# Patient Record
Sex: Female | Born: 1960 | Race: White | Hispanic: No | Smoking: Current every day smoker
Health system: Southern US, Community
[De-identification: ages and names within clinical notes are randomized; demographics above are authoritative.]

## PROBLEM LIST (undated history)

## (undated) DIAGNOSIS — F419 Anxiety disorder, unspecified: Secondary | ICD-10-CM

## (undated) DIAGNOSIS — F32A Depression, unspecified: Secondary | ICD-10-CM

## (undated) DIAGNOSIS — Z720 Tobacco use: Secondary | ICD-10-CM

## (undated) DIAGNOSIS — C801 Malignant (primary) neoplasm, unspecified: Secondary | ICD-10-CM

## (undated) DIAGNOSIS — J449 Chronic obstructive pulmonary disease, unspecified: Secondary | ICD-10-CM

## (undated) DIAGNOSIS — J45909 Unspecified asthma, uncomplicated: Secondary | ICD-10-CM

## (undated) DIAGNOSIS — J42 Unspecified chronic bronchitis: Secondary | ICD-10-CM

## (undated) DIAGNOSIS — Z8669 Personal history of other diseases of the nervous system and sense organs: Secondary | ICD-10-CM

## (undated) DIAGNOSIS — D72829 Elevated white blood cell count, unspecified: Secondary | ICD-10-CM

## (undated) HISTORY — PX: LEEP: SHX91

## (undated) HISTORY — PX: ABDOMINAL HYSTERECTOMY: SHX81

## (undated) HISTORY — DX: Chronic obstructive pulmonary disease, unspecified: J44.9

## (undated) HISTORY — DX: Unspecified asthma, uncomplicated: J45.909

---

## 2006-09-15 ENCOUNTER — Ambulatory Visit: Payer: Self-pay | Admitting: Unknown Physician Specialty

## 2007-09-19 ENCOUNTER — Ambulatory Visit: Payer: Self-pay | Admitting: Unknown Physician Specialty

## 2008-09-23 ENCOUNTER — Ambulatory Visit: Payer: Self-pay | Admitting: Unknown Physician Specialty

## 2009-09-29 ENCOUNTER — Ambulatory Visit: Payer: Self-pay | Admitting: Unknown Physician Specialty

## 2010-10-06 ENCOUNTER — Ambulatory Visit: Payer: Self-pay | Admitting: Unknown Physician Specialty

## 2011-11-01 ENCOUNTER — Ambulatory Visit: Payer: Self-pay | Admitting: Unknown Physician Specialty

## 2011-11-04 ENCOUNTER — Ambulatory Visit: Payer: Self-pay | Admitting: Unknown Physician Specialty

## 2013-05-16 ENCOUNTER — Ambulatory Visit: Payer: Self-pay | Admitting: Internal Medicine

## 2013-11-20 ENCOUNTER — Ambulatory Visit: Payer: Self-pay | Admitting: Internal Medicine

## 2016-04-21 ENCOUNTER — Other Ambulatory Visit: Payer: Self-pay | Admitting: Internal Medicine

## 2016-04-21 DIAGNOSIS — Z1231 Encounter for screening mammogram for malignant neoplasm of breast: Secondary | ICD-10-CM

## 2016-04-28 ENCOUNTER — Ambulatory Visit: Admission: RE | Admit: 2016-04-28 | Payer: Self-pay | Source: Ambulatory Visit

## 2016-11-02 ENCOUNTER — Ambulatory Visit
Admission: RE | Admit: 2016-11-02 | Discharge: 2016-11-02 | Disposition: A | Discharge status: Home / Self Care | Payer: Managed Care, Other (non HMO) | Source: Ambulatory Visit | Attending: Internal Medicine | Admitting: Internal Medicine

## 2016-11-02 ENCOUNTER — Ambulatory Visit: Payer: Self-pay

## 2016-11-02 DIAGNOSIS — Z1231 Encounter for screening mammogram for malignant neoplasm of breast: Secondary | ICD-10-CM | POA: Diagnosis present

## 2016-11-02 HISTORY — DX: Malignant (primary) neoplasm, unspecified: C80.1

## 2017-12-01 ENCOUNTER — Other Ambulatory Visit: Payer: Self-pay | Admitting: Internal Medicine

## 2017-12-01 DIAGNOSIS — Z1231 Encounter for screening mammogram for malignant neoplasm of breast: Secondary | ICD-10-CM

## 2017-12-06 ENCOUNTER — Ambulatory Visit: Payer: Managed Care, Other (non HMO)

## 2018-01-03 ENCOUNTER — Ambulatory Visit: Payer: Managed Care, Other (non HMO)

## 2018-01-16 ENCOUNTER — Ambulatory Visit
Admission: RE | Admit: 2018-01-16 | Discharge: 2018-01-16 | Disposition: A | Discharge status: Home / Self Care | Payer: 59 | Source: Ambulatory Visit | Attending: Internal Medicine | Admitting: Internal Medicine

## 2018-01-16 DIAGNOSIS — Z1231 Encounter for screening mammogram for malignant neoplasm of breast: Secondary | ICD-10-CM

## 2019-10-09 ENCOUNTER — Other Ambulatory Visit: Payer: Self-pay | Admitting: Internal Medicine

## 2019-10-09 DIAGNOSIS — Z1231 Encounter for screening mammogram for malignant neoplasm of breast: Secondary | ICD-10-CM

## 2020-10-07 ENCOUNTER — Other Ambulatory Visit: Payer: Self-pay

## 2020-10-07 ENCOUNTER — Ambulatory Visit
Admission: RE | Admit: 2020-10-07 | Discharge: 2020-10-07 | Disposition: A | Discharge status: Home / Self Care | Payer: 59 | Source: Ambulatory Visit | Attending: Internal Medicine | Admitting: Internal Medicine

## 2020-10-07 DIAGNOSIS — Z1231 Encounter for screening mammogram for malignant neoplasm of breast: Secondary | ICD-10-CM | POA: Insufficient documentation

## 2021-02-09 IMAGING — MG DIGITAL SCREENING BILAT W/ TOMO W/ CAD
8 series · 8 of 24 positions shown · non-contrast
Comparison: Previous exam(s).

ACR Breast Density Category a: The breast tissue is almost entirely
fatty.

CLINICAL DATA: Screening.

EXAM:
DIGITAL SCREENING BILATERAL MAMMOGRAM WITH TOMO AND CAD

[R MLO synth-2D]
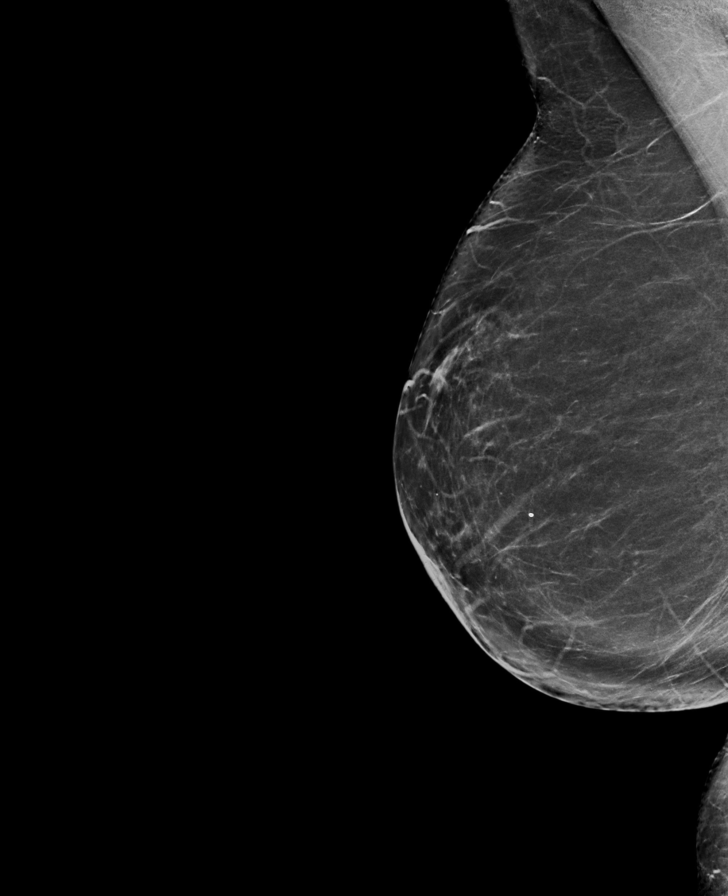

[L MLO synth-2D]
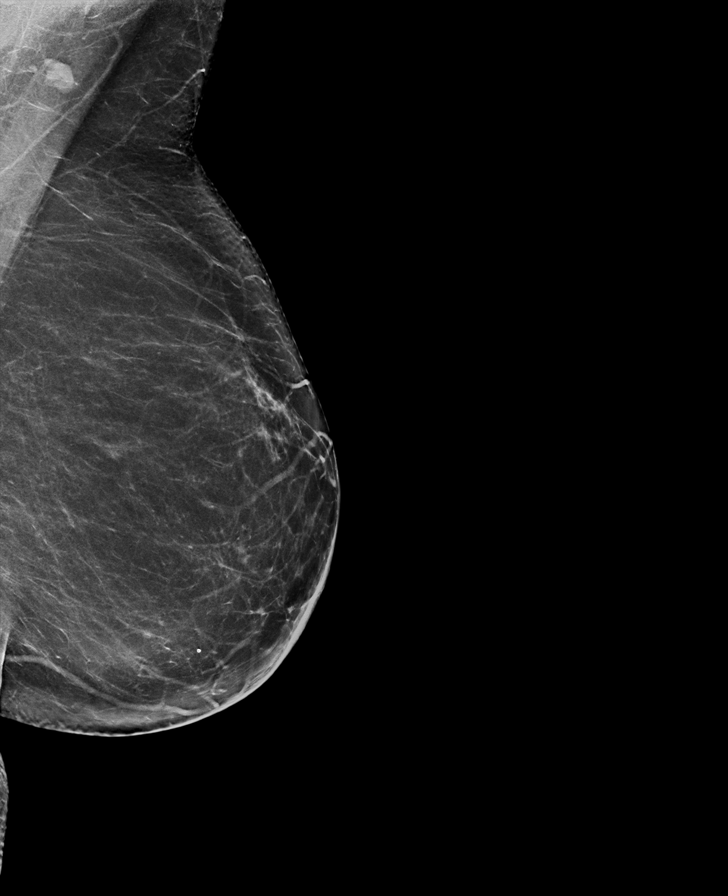

[R CC synth-2D]
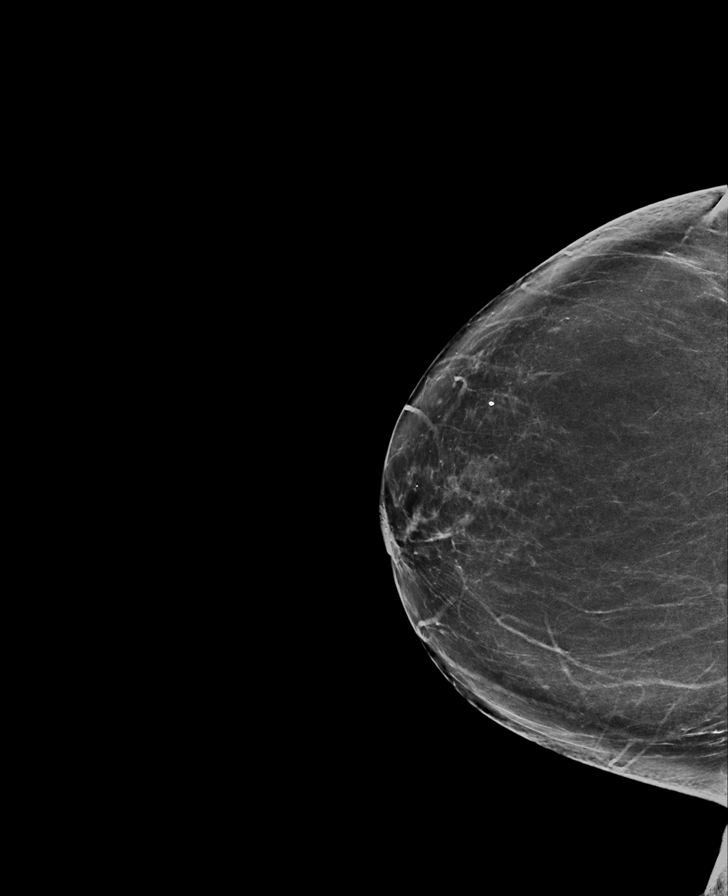

[L CC synth-2D]
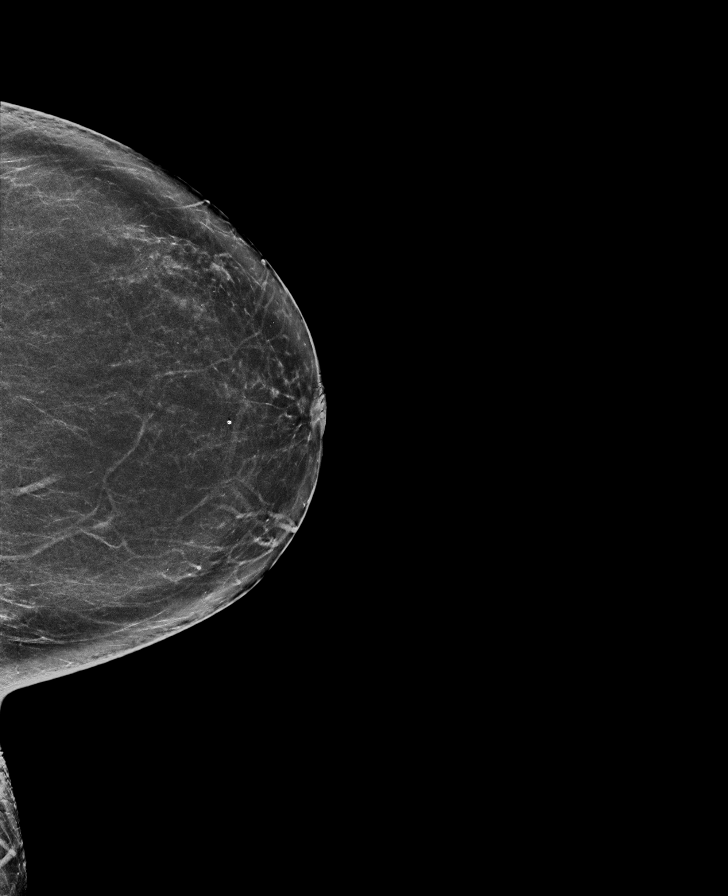

[L MLO tomo · tomo slice 39/78.0]
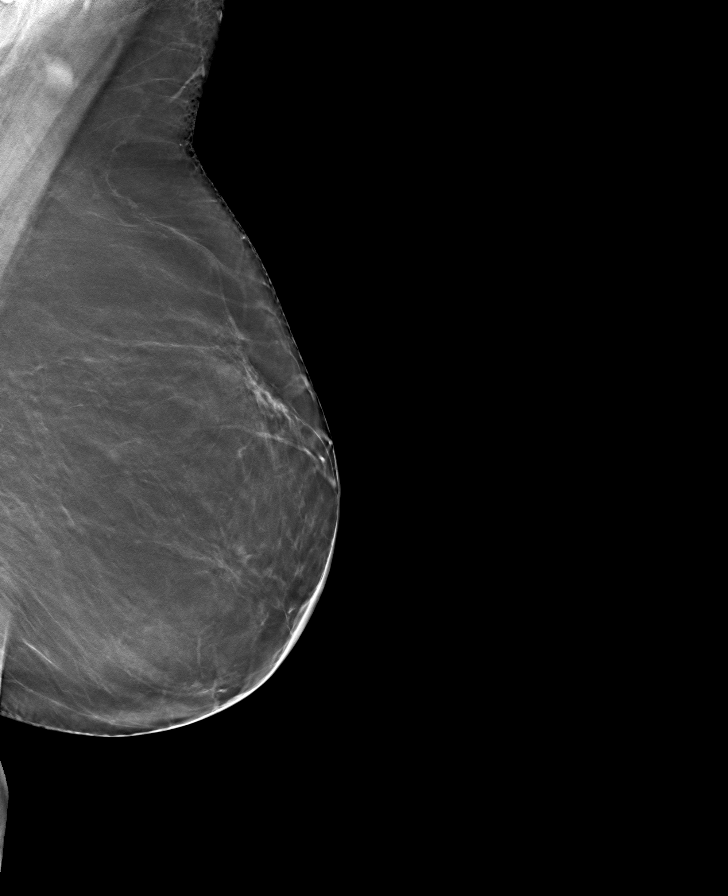

[R MLO tomo · tomo slice 39/78.0]
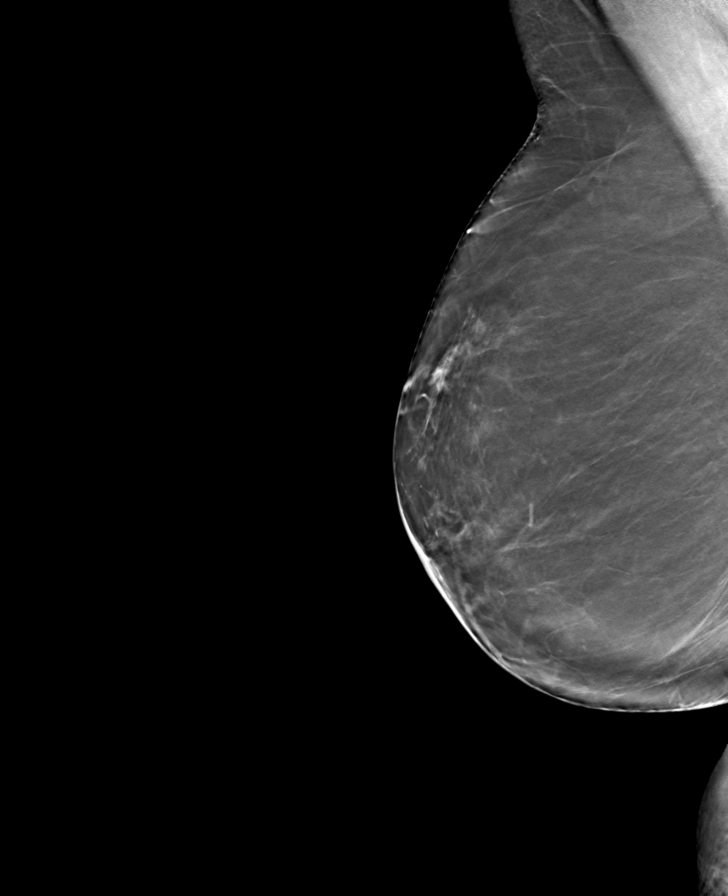

[R CC tomo · tomo slice 39/78.0]
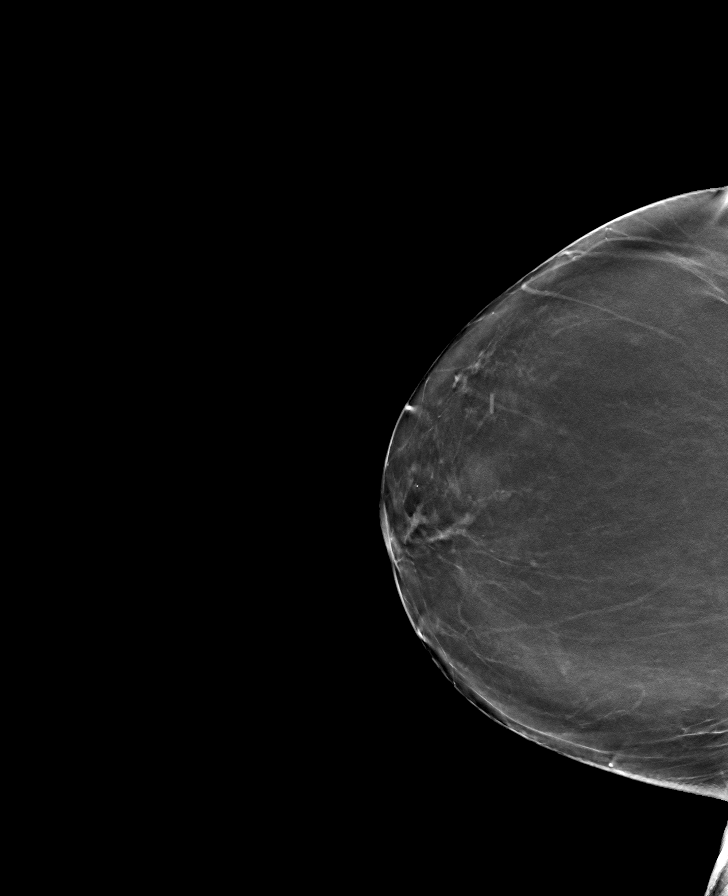

[L CC tomo · tomo slice 37/72.0]
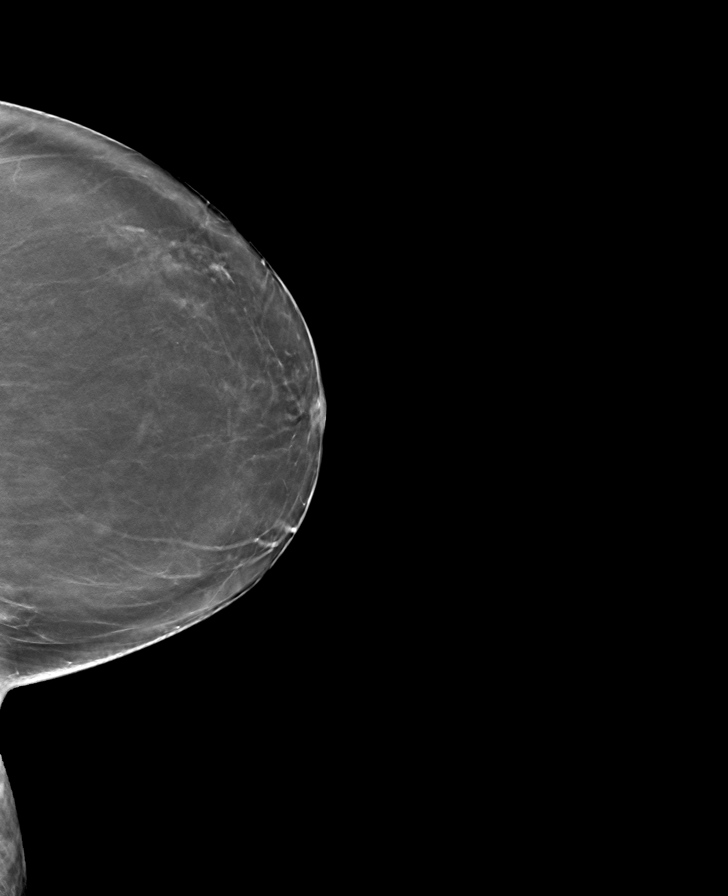

[8 of 24 positions shown; findings below may reference images not displayed]

FINDINGS: There are no findings suspicious for malignancy. Images were
processed with CAD.
IMPRESSION: No mammographic evidence of malignancy. A result letter of this
screening mammogram will be mailed directly to the patient.

RECOMMENDATION:
Screening mammogram in one year. (Code:[id number])

BI-RADS CATEGORY  1: Negative.

## 2022-07-12 ENCOUNTER — Other Ambulatory Visit: Payer: Self-pay | Admitting: Internal Medicine

## 2022-07-12 DIAGNOSIS — Z1231 Encounter for screening mammogram for malignant neoplasm of breast: Secondary | ICD-10-CM

## 2022-08-10 ENCOUNTER — Inpatient Hospital Stay: Payer: 59

## 2022-08-10 ENCOUNTER — Inpatient Hospital Stay: Payer: 59 | Attending: Oncology | Admitting: Oncology

## 2022-08-10 ENCOUNTER — Encounter: Payer: Self-pay | Admitting: Oncology

## 2022-08-10 VITALS — BP 140/84 | HR 87 | Temp 98.2°F | Ht 63.0 in | Wt 156.0 lb

## 2022-08-10 DIAGNOSIS — D72829 Elevated white blood cell count, unspecified: Secondary | ICD-10-CM | POA: Insufficient documentation

## 2022-08-10 DIAGNOSIS — Z79899 Other long term (current) drug therapy: Secondary | ICD-10-CM | POA: Insufficient documentation

## 2022-08-10 DIAGNOSIS — F17210 Nicotine dependence, cigarettes, uncomplicated: Secondary | ICD-10-CM | POA: Diagnosis not present

## 2022-08-10 DIAGNOSIS — Z801 Family history of malignant neoplasm of trachea, bronchus and lung: Secondary | ICD-10-CM | POA: Diagnosis not present

## 2022-08-10 DIAGNOSIS — Z720 Tobacco use: Secondary | ICD-10-CM

## 2022-08-10 LAB — CBC WITH DIFFERENTIAL/PLATELET
Abs Immature Granulocytes: 0.03 10*3/uL (ref 0.00–0.07)
Basophils Absolute: 0.1 10*3/uL (ref 0.0–0.1)
Basophils Relative: 1 %
Eosinophils Absolute: 0.2 10*3/uL (ref 0.0–0.5)
Eosinophils Relative: 2 %
HCT: 41.7 % (ref 36.0–46.0)
Hemoglobin: 13.8 g/dL (ref 12.0–15.0)
Immature Granulocytes: 0 %
Lymphocytes Relative: 43 %
Lymphs Abs: 3.8 10*3/uL (ref 0.7–4.0)
MCH: 32.4 pg (ref 26.0–34.0)
MCHC: 33.1 g/dL (ref 30.0–36.0)
MCV: 97.9 fL (ref 80.0–100.0)
Monocytes Absolute: 0.7 10*3/uL (ref 0.1–1.0)
Monocytes Relative: 8 %
Neutro Abs: 3.9 10*3/uL (ref 1.7–7.7)
Neutrophils Relative %: 46 %
Platelets: 391 10*3/uL (ref 150–400)
RBC: 4.26 MIL/uL (ref 3.87–5.11)
RDW: 13.8 % (ref 11.5–15.5)
WBC: 8.7 10*3/uL (ref 4.0–10.5)
nRBC: 0 % (ref 0.0–0.2)

## 2022-08-10 LAB — HEPATITIS PANEL, ACUTE
HCV Ab: NONREACTIVE
Hep A IgM: NONREACTIVE
Hep B C IgM: NONREACTIVE
Hepatitis B Surface Ag: NONREACTIVE

## 2022-08-10 LAB — COMPREHENSIVE METABOLIC PANEL
ALT: 14 U/L (ref 0–44)
AST: 20 U/L (ref 15–41)
Albumin: 4.2 g/dL (ref 3.5–5.0)
Alkaline Phosphatase: 62 U/L (ref 38–126)
Anion gap: 7 (ref 5–15)
BUN: 14 mg/dL (ref 6–20)
CO2: 26 mmol/L (ref 22–32)
Calcium: 9.1 mg/dL (ref 8.9–10.3)
Chloride: 106 mmol/L (ref 98–111)
Creatinine, Ser: 1.19 mg/dL — ABNORMAL HIGH (ref 0.44–1.00)
GFR, Estimated: 52 mL/min — ABNORMAL LOW (ref >60)
Glucose, Bld: 103 mg/dL — ABNORMAL HIGH (ref 70–99)
Potassium: 3.8 mmol/L (ref 3.5–5.1)
Sodium: 139 mmol/L (ref 135–145)
Total Bilirubin: 0.5 mg/dL (ref 0.3–1.2)
Total Protein: 7.7 g/dL (ref 6.5–8.1)

## 2022-08-10 LAB — LACTATE DEHYDROGENASE: LDH: 154 U/L (ref 98–192)

## 2022-08-10 NOTE — Assessment & Plan Note (Signed)
Patient has about 63-pack-year smoking history.  Recommend patient to start lung cancer screening.  Refer to lung cancer screening program.

## 2022-08-10 NOTE — Progress Notes (Signed)
[place] NOTE  Patient Care Team: Tracie Harrier, MD as PCP - General (Internal Medicine)  ASSESSMENT & PLAN  Leukocytosis Labs are reviewed and discussed with patient.  chronic lymphocytosis.  This could be secondary to smoking, acute on chronic inflammation, underlying bone marrow disease. Recommend to check CBC, smear, CMP, LDH, peripheral blood flow cytometry, HIV, hepatitis panel, multiple myeloma panel and a light chain ratio.  Tobacco use Patient has about 63-pack-year smoking history.  Recommend patient to start lung cancer screening.  Refer to lung cancer screening program.   Orders Placed This Encounter  Procedures   CBC with Differential/Platelet    Standing Status:   Future    Number of Occurrences:   1    Standing Expiration Date:   08/11/2023   Comprehensive metabolic panel    Standing Status:   Future    Number of Occurrences:   1    Standing Expiration Date:   08/11/2023   Lactate dehydrogenase    Standing Status:   Future    Number of Occurrences:   1    Standing Expiration Date:   08/11/2023   Flow cytometry panel-leukemia/lymphoma work-up    Standing Status:   Future    Number of Occurrences:   1    Standing Expiration Date:   08/11/2023   HIV Antibody (routine testing w rflx)    Standing Status:   Future    Number of Occurrences:   1    Standing Expiration Date:   08/11/2023   Hepatitis panel, acute    Standing Status:   Future    Number of Occurrences:   1    Standing Expiration Date:   08/11/2023   Kappa/lambda light chains    Standing Status:   Future    Number of Occurrences:   1    Standing Expiration Date:   08/11/2023   Multiple Myeloma Panel (SPEP&IFE w/QIG)    Standing Status:   Future    Number of Occurrences:   1    Standing Expiration Date:   08/11/2023   Ambulatory Referral for Lung Cancer Screening    Referral Priority:   Routine    Referral Type:   Consultation    Referral Reason:   Specialty Services  Required    Number of Visits Requested:   1    All questions were answered. The patient knows to call the clinic with any problems, questions or concerns. Patient will follow-up in 3 to 4 weeks to review results.   Earlie Server, MD, PhD [facility] Hematology Oncology 08/10/2022       CHIEF COMPLAINTS/PURPOSE OF CONSULTATION:  Leukocytosis/elevate white count  HISTORY OF PRESENTING ILLNESS:  Connie Anderson 61 y.o. female is here because of elevated WBC.  She was found to have abnormal CBC on 07/23/2022, total white count 12.2, predominantly lymphocytosis with absolute lymphocytes 6.11.  Increased lymphocyte percentage 50.3%. Patient is off cigarettes.  She has smoked since she was 61 years of age, 1.5 packs daily on average for 42 years.  Starting 3 years ago, she has cut down to currently 4 cigarettes/day.  She works in the cigarette company  She denies recent infection.  There is not reported symptoms of sinus congestion, cough, urinary frequency/urgency or dysuria, diarrhea, joint swelling/pain or abnormal skin rash.   Her age appropriate screening programs are up-to-date. The patient has no prior diagnosis of autoimmune disease and was not prescribed corticosteroids related products.   MEDICAL HISTORY:  Past Medical History:  Diagnosis Date   Asthma    Cancer ([facility])    cervical   COPD (chronic obstructive pulmonary disease) (HCC)     SURGICAL HISTORY: Past Surgical History:  Procedure Laterality Date   ABDOMINAL HYSTERECTOMY      SOCIAL HISTORY: Social History   Socioeconomic History   Marital status: Divorced    Spouse name: Not on file   Number of children: Not on file   Years of education: Not on file   Highest education level: Not on file  Occupational History   Not on file  Tobacco Use   Smoking status: Every Day    Packs/day: 1.40    Years: 45.00    Total pack years: 63.00    Types: Cigarettes   Smokeless tobacco: Never  Substance and Sexual  Activity   Alcohol use: Not Currently   Drug use: Not Currently   Sexual activity: Not Currently  Other Topics Concern   Not on file  Social History Narrative   Not on file   Social Determinants of Health   Financial Resource Strain: Not on file  Food Insecurity: Not on file  Transportation Needs: Not on file  Physical Activity: Not on file  Stress: Not on file  Social Connections: Not on file  Intimate Partner Violence: Not on file    FAMILY HISTORY: Family History  Problem Relation Age of Onset   Dementia Mother    Diabetes Mother    Lung cancer Father    Heart disease Maternal Grandmother    Diabetes Maternal Grandfather    Breast cancer Neg Hx     ALLERGIES:  has no allergies on file.  MEDICATIONS:  Current Outpatient Medications  Medication Sig Dispense Refill   ADVAIR DISKUS 100-50 MCG/ACT AEPB Inhale 1 puff into the lungs 2 (two) times daily.     albuterol (VENTOLIN HFA) 108 (90 Base) MCG/ACT inhaler Inhale into the lungs.     buPROPion (WELLBUTRIN XL) 300 MG 24 hr tablet Take by mouth.     montelukast (SINGULAIR) 10 MG tablet Take 10 mg by mouth daily.     rizatriptan (MAXALT-MLT) 10 MG disintegrating tablet Take by mouth.     Vitamin D, Ergocalciferol, (DRISDOL) 1.25 MG (50000 UNIT) CAPS capsule Take 50,000 Units by mouth once a week.     No current facility-administered medications for this visit.    Review of Systems  Constitutional:  Negative for appetite change, chills, fatigue and fever.  HENT:   Negative for hearing loss and voice change.   Eyes:  Negative for eye problems.  Respiratory:  Negative for chest tightness and cough.   Cardiovascular:  Negative for chest pain.  Gastrointestinal:  Negative for abdominal distention, abdominal pain and blood in stool.  Endocrine: Negative for hot flashes.  Genitourinary:  Negative for difficulty urinating and frequency.   Musculoskeletal:  Negative for arthralgias.  Skin:  Negative for itching and rash.   Neurological:  Negative for extremity weakness.  Hematological:  Negative for adenopathy.  Psychiatric/Behavioral:  Negative for confusion.      PHYSICAL EXAMINATION: ECOG PERFORMANCE STATUS: 0 - Asymptomatic  Vitals:   08/10/22 0937  BP: (!) 140/84  Pulse: 87  Temp: 98.2 F (36.8 C)   Filed Weights   08/10/22 0937  Weight: 156 lb (70.8 kg)    Physical Exam Constitutional:      General: She is not in acute distress.    Appearance: She is not diaphoretic.  HENT:  Head: Normocephalic and atraumatic.     Nose: Nose normal.     Mouth/Throat:     Pharynx: No oropharyngeal exudate.  Eyes:     General: No scleral icterus.    Pupils: Pupils are equal, round, and reactive to light.  Cardiovascular:     Rate and Rhythm: Normal rate and regular rhythm.     Heart sounds: No murmur heard. Pulmonary:     Effort: Pulmonary effort is normal. No respiratory distress.     Comments: Decreased breath sound bilaterally Abdominal:     General: There is no distension.     Palpations: Abdomen is soft.     Tenderness: There is no abdominal tenderness.  Musculoskeletal:        General: Normal range of motion.     Cervical back: Normal range of motion and neck supple.  Skin:    General: Skin is warm and dry.     Findings: No erythema.  Neurological:     Mental Status: She is alert and oriented to person, place, and time.     Cranial Nerves: No cranial nerve deficit.     Motor: No abnormal muscle tone.     Coordination: Coordination normal.  Psychiatric:        Mood and Affect: Affect normal.     LABORATORY DATA:  I have reviewed the data as listed    Latest Ref Rng & Units 08/10/2022   10:07 AM  CBC  WBC 4.0 - 10.5 K/uL 8.7   Hemoglobin 12.0 - 15.0 g/dL 13.8   Hematocrit 36.0 - 46.0 % 41.7   Platelets 150 - 400 K/uL 391       Latest Ref Rng & Units 08/10/2022   10:07 AM  CMP  Glucose 70 - 99 mg/dL 103   BUN 6 - 20 mg/dL 14   Creatinine 0.44 - 1.00 mg/dL 1.19    Sodium 135 - 145 mmol/L 139   Potassium 3.5 - 5.1 mmol/L 3.8   Chloride 98 - 111 mmol/L 106   CO2 22 - 32 mmol/L 26   Calcium 8.9 - 10.3 mg/dL 9.1   Total Protein 6.5 - 8.1 g/dL 7.7   Total Bilirubin 0.3 - 1.2 mg/dL 0.5   Alkaline Phos 38 - 126 U/L 62   AST 15 - 41 U/L 20   ALT 0 - 44 U/L 14    Lab Results  Component Value Date   LDH 154 08/10/2022    RADIOGRAPHIC STUDIES: I have personally reviewed the radiological images as listed and agreed with the findings in the report. No results found.

## 2022-08-10 NOTE — Assessment & Plan Note (Signed)
Labs are reviewed and discussed with patient.  chronic lymphocytosis.  This could be secondary to smoking, acute on chronic inflammation, underlying bone marrow disease. Recommend to check CBC, smear, CMP, LDH, peripheral blood flow cytometry, HIV, hepatitis panel, multiple myeloma panel and a light chain ratio.

## 2022-08-11 LAB — KAPPA/LAMBDA LIGHT CHAINS
Kappa free light chain: 22 mg/L — ABNORMAL HIGH (ref 3.3–19.4)
Kappa, lambda light chain ratio: 1.46 (ref 0.26–1.65)
Lambda free light chains: 15.1 mg/L (ref 5.7–26.3)

## 2022-08-11 LAB — HIV ANTIBODY (ROUTINE TESTING W REFLEX): HIV Screen 4th Generation wRfx: NONREACTIVE

## 2022-08-12 LAB — COMP PANEL: LEUKEMIA/LYMPHOMA

## 2022-08-12 LAB — MULTIPLE MYELOMA PANEL, SERUM
Albumin SerPl Elph-Mcnc: 3.8 g/dL (ref 2.9–4.4)
Albumin/Glob SerPl: 1.4 (ref 0.7–1.7)
Alpha 1: 0.2 g/dL (ref 0.0–0.4)
Alpha2 Glob SerPl Elph-Mcnc: 0.8 g/dL (ref 0.4–1.0)
B-Globulin SerPl Elph-Mcnc: 1 g/dL (ref 0.7–1.3)
Gamma Glob SerPl Elph-Mcnc: 0.7 g/dL (ref 0.4–1.8)
Globulin, Total: 2.8 g/dL (ref 2.2–3.9)
IgA: 232 mg/dL (ref 87–352)
IgG (Immunoglobin G), Serum: 798 mg/dL (ref 586–1602)
IgM (Immunoglobulin M), Srm: 42 mg/dL (ref 26–217)
Total Protein ELP: 6.6 g/dL (ref 6.0–8.5)

## 2022-09-03 ENCOUNTER — Inpatient Hospital Stay: Payer: 59 | Attending: Oncology | Admitting: Oncology

## 2022-09-03 ENCOUNTER — Encounter: Payer: Self-pay | Admitting: Oncology

## 2022-09-03 VITALS — BP 159/78 | HR 82 | Temp 98.8°F | Resp 20 | Wt 156.7 lb

## 2022-09-03 DIAGNOSIS — Z79899 Other long term (current) drug therapy: Secondary | ICD-10-CM | POA: Diagnosis not present

## 2022-09-03 DIAGNOSIS — F17210 Nicotine dependence, cigarettes, uncomplicated: Secondary | ICD-10-CM | POA: Insufficient documentation

## 2022-09-03 DIAGNOSIS — Z720 Tobacco use: Secondary | ICD-10-CM

## 2022-09-03 DIAGNOSIS — D72829 Elevated white blood cell count, unspecified: Secondary | ICD-10-CM | POA: Diagnosis present

## 2022-09-03 NOTE — Assessment & Plan Note (Signed)
Labs are reviewed and discussed with patient. SPEP showed negative M protein, light chain ratio is normal.  Slightly elevated free kappa level.  Peripheral Peripheral blood flowcytometry showed The kappa to lambda ratio is slightly increased. However, there is no aberrant B cell antigen expression or co-expression of CD5 or CD10 to  suggest that a clonal population is present  Recommend observation and repeat labs in 4 months.

## 2022-09-03 NOTE — Progress Notes (Signed)
[place] NOTE  Patient Care Team: Tracie Harrier, MD as PCP - General (Internal Medicine)  ASSESSMENT & PLAN  Leukocytosis Labs are reviewed and discussed with patient. SPEP showed negative M protein, light chain ratio is normal.  Slightly elevated free kappa level.  Peripheral Peripheral blood flowcytometry showed The kappa to lambda ratio is slightly increased. However, there is no aberrant B cell antigen expression or co-expression of CD5 or CD10 to  suggest that a clonal population is present  Recommend observation and repeat labs in 4 months.   Tobacco use Patient has about 63-pack-year smoking history.  Recommend patient to start lung cancer screening.  Refer to lung cancer screening program.   Orders Placed This Encounter  Procedures   CBC with Differential/Platelet    Standing Status:   Future    Standing Expiration Date:   09/04/2023   Comprehensive metabolic panel    Standing Status:   Future    Standing Expiration Date:   09/03/2023   Lactate dehydrogenase    Standing Status:   Future    Standing Expiration Date:   09/04/2023   Flow cytometry panel-leukemia/lymphoma work-up    Standing Status:   Future    Standing Expiration Date:   09/04/2023    All questions were answered. The patient knows to call the clinic with any problems, questions or concerns. Patient will follow-up in 4 months.    Earlie Server, MD, PhD [facility] Hematology Oncology 09/03/2022       CHIEF COMPLAINTS/PURPOSE OF CONSULTATION:  Leukocytosis/elevate white count  HISTORY OF PRESENTING ILLNESS:  Connie Anderson 62 y.o. female is here because of elevated WBC.  She was found to have abnormal CBC on 07/23/2022, total white count 12.2, predominantly lymphocytosis with absolute lymphocytes 6.11.  Increased lymphocyte percentage 50.3%. Patient is off cigarettes.  She has smoked since she was 61 years of age, 1.5 packs daily on average for 42 years.  Starting 3 years ago,  she has cut down to currently 4 cigarettes/day.  She works in the cigarette company  She denies recent infection.  There is not reported symptoms of sinus congestion, cough, urinary frequency/urgency or dysuria, diarrhea, joint swelling/pain or abnormal skin rash.   Her age appropriate screening programs are up-to-date. The patient has no prior diagnosis of autoimmune disease and was not prescribed corticosteroids related products.  INTERVAL HISTORY Connie Anderson is a 61 y.o. female who has above history reviewed by me today presents for follow up visit to review results. Patient has no new complaints.  Problems and complaints are listed below:  MEDICAL HISTORY:  Past Medical History:  Diagnosis Date   Asthma    Cancer ([facility])    cervical   COPD (chronic obstructive pulmonary disease) (HCC)     SURGICAL HISTORY: Past Surgical History:  Procedure Laterality Date   ABDOMINAL HYSTERECTOMY      SOCIAL HISTORY: Social History   Socioeconomic History   Marital status: Divorced    Spouse name: Not on file   Number of children: Not on file   Years of education: Not on file   Highest education level: Not on file  Occupational History   Not on file  Tobacco Use   Smoking status: Every Day    Packs/day: 1.40    Years: 45.00    Total pack years: 63.00    Types: Cigarettes   Smokeless tobacco: Never  Substance and Sexual Activity   Alcohol use: Not Currently   Drug  use: Not Currently   Sexual activity: Not Currently  Other Topics Concern   Not on file  Social History Narrative   Not on file   Social Determinants of Health   Financial Resource Strain: Not on file  Food Insecurity: Not on file  Transportation Needs: Not on file  Physical Activity: Not on file  Stress: Not on file  Social Connections: Not on file  Intimate Partner Violence: Not on file    FAMILY HISTORY: Family History  Problem Relation Age of Onset   Dementia Mother    Diabetes Mother    Lung  cancer Father    Heart disease Maternal Grandmother    Diabetes Maternal Grandfather    Breast cancer Neg Hx     ALLERGIES:  is allergic to tetracycline.  MEDICATIONS:  Current Outpatient Medications  Medication Sig Dispense Refill   ADVAIR DISKUS 100-50 MCG/ACT AEPB Inhale 1 puff into the lungs 2 (two) times daily.     albuterol (VENTOLIN HFA) 108 (90 Base) MCG/ACT inhaler Inhale into the lungs.     buPROPion (WELLBUTRIN XL) 300 MG 24 hr tablet Take by mouth.     montelukast (SINGULAIR) 10 MG tablet Take 10 mg by mouth daily.     rizatriptan (MAXALT-MLT) 10 MG disintegrating tablet Take by mouth.     Vitamin D, Ergocalciferol, (DRISDOL) 1.25 MG (50000 UNIT) CAPS capsule Take 50,000 Units by mouth once a week.     No current facility-administered medications for this visit.    Review of Systems  Constitutional:  Negative for appetite change, chills, fatigue and fever.  HENT:   Negative for hearing loss and voice change.   Eyes:  Negative for eye problems.  Respiratory:  Negative for chest tightness and cough.   Cardiovascular:  Negative for chest pain.  Gastrointestinal:  Negative for abdominal distention, abdominal pain and blood in stool.  Endocrine: Negative for hot flashes.  Genitourinary:  Negative for difficulty urinating and frequency.   Musculoskeletal:  Negative for arthralgias.  Skin:  Negative for itching and rash.  Neurological:  Negative for extremity weakness.  Hematological:  Negative for adenopathy.  Psychiatric/Behavioral:  Negative for confusion.      PHYSICAL EXAMINATION: ECOG PERFORMANCE STATUS: 0 - Asymptomatic  Vitals:   09/03/22 0941  BP: (!) 159/78  Pulse: 82  Resp: 20  Temp: 98.8 F (37.1 C)  SpO2: 98%   Filed Weights   09/03/22 0941  Weight: 156 lb 11.2 oz (71.1 kg)    Physical Exam Constitutional:      General: She is not in acute distress.    Appearance: She is not diaphoretic.  HENT:     Head: Normocephalic and atraumatic.      Nose: Nose normal.     Mouth/Throat:     Pharynx: No oropharyngeal exudate.  Eyes:     General: No scleral icterus.    Pupils: Pupils are equal, round, and reactive to light.  Cardiovascular:     Rate and Rhythm: Normal rate and regular rhythm.     Heart sounds: No murmur heard. Pulmonary:     Effort: Pulmonary effort is normal. No respiratory distress.     Comments: Decreased breath sound bilaterally Abdominal:     General: There is no distension.     Palpations: Abdomen is soft.     Tenderness: There is no abdominal tenderness.  Musculoskeletal:        General: Normal range of motion.     Cervical back: Normal range of  motion and neck supple.  Skin:    General: Skin is warm and dry.     Findings: No erythema.  Neurological:     Mental Status: She is alert and oriented to person, place, and time.     Cranial Nerves: No cranial nerve deficit.     Motor: No abnormal muscle tone.     Coordination: Coordination normal.  Psychiatric:        Mood and Affect: Affect normal.     LABORATORY DATA:  I have reviewed the data as listed     Latest Ref Rng & Units 08/10/2022   10:07 AM  CBC  WBC 4.0 - 10.5 K/uL 8.7   Hemoglobin 12.0 - 15.0 g/dL 13.8   Hematocrit 36.0 - 46.0 % 41.7   Platelets 150 - 400 K/uL 391       Latest Ref Rng & Units 08/10/2022   10:07 AM  CMP  Glucose 70 - 99 mg/dL 103   BUN 6 - 20 mg/dL 14   Creatinine 0.44 - 1.00 mg/dL 1.19   Sodium 135 - 145 mmol/L 139   Potassium 3.5 - 5.1 mmol/L 3.8   Chloride 98 - 111 mmol/L 106   CO2 22 - 32 mmol/L 26   Calcium 8.9 - 10.3 mg/dL 9.1   Total Protein 6.5 - 8.1 g/dL 7.7   Total Bilirubin 0.3 - 1.2 mg/dL 0.5   Alkaline Phos 38 - 126 U/L 62   AST 15 - 41 U/L 20   ALT 0 - 44 U/L 14        RADIOGRAPHIC STUDIES: I have personally reviewed the radiological images as listed and agreed with the findings in the report. No results found.

## 2022-09-03 NOTE — Assessment & Plan Note (Signed)
Patient has about 63-pack-year smoking history.  Recommend patient to start lung cancer screening.  Refer to lung cancer screening program.

## 2022-09-07 ENCOUNTER — Other Ambulatory Visit: Payer: Self-pay

## 2022-09-07 DIAGNOSIS — Z122 Encounter for screening for malignant neoplasm of respiratory organs: Secondary | ICD-10-CM

## 2022-09-07 DIAGNOSIS — Z87891 Personal history of nicotine dependence: Secondary | ICD-10-CM

## 2022-09-07 DIAGNOSIS — F17210 Nicotine dependence, cigarettes, uncomplicated: Secondary | ICD-10-CM

## 2022-09-29 ENCOUNTER — Encounter: Payer: Self-pay | Admitting: Acute Care

## 2022-09-29 ENCOUNTER — Ambulatory Visit (INDEPENDENT_AMBULATORY_CARE_PROVIDER_SITE_OTHER): Payer: 59 | Admitting: Acute Care

## 2022-09-29 DIAGNOSIS — F17210 Nicotine dependence, cigarettes, uncomplicated: Secondary | ICD-10-CM | POA: Diagnosis not present

## 2022-09-29 NOTE — Progress Notes (Signed)
Virtual Visit via Telephone Note  I connected with Connie Anderson on 09/29/22 at  8:30 AM EDT by telephone and verified that I am speaking with the correct person using two identifiers.  Location: Patient:  At home Provider:  [address], [place], [place], [address]    I discussed the limitations, risks, security and privacy concerns of performing an evaluation and management service by telephone and the availability of in person appointments. I also discussed with the patient that there may be a patient responsible charge related to this service. The patient expressed understanding and agreed to proceed.     Shared Decision Making Visit Lung Cancer Screening Program [phone])   Eligibility: Age 61 y.o. Pack Years Smoking History Calculation 45 pack year smoking history (# packs/per year x # years smoked) Recent History of coughing up blood  no Unexplained weight loss? no ( >Than 15 pounds within the last 6 months ) Prior History Lung / other cancer no (Diagnosis within the last 5 years already requiring surveillance chest CT Scans). Smoking Status Current Smoker Former Smokers: Years since quit:  NA  Quit Date:  NA  Visit Components: Discussion included one or more decision making aids. yes Discussion included risk/benefits of screening. yes Discussion included potential follow up diagnostic testing for abnormal scans. yes Discussion included meaning and risk of over diagnosis. yes Discussion included meaning and risk of False Positives. yes Discussion included meaning of total radiation exposure. yes  Counseling Included: Importance of adherence to annual lung cancer LDCT screening. yes Impact of comorbidities on ability to participate in the program. yes Ability and willingness to under diagnostic treatment. yes  Smoking Cessation Counseling: Current Smokers:  Discussed importance of smoking cessation. yes Information about tobacco cessation classes and  interventions provided to patient. yes Patient provided with "ticket" for LDCT Scan. yes Symptomatic Patient. no  Counseling NA Diagnosis Code: Tobacco Use Z72.0 Asymptomatic Patient yes  Counseling (Intermediate counseling: > three minutes counseling) K2409 Former Smokers:  Discussed the importance of maintaining cigarette abstinence. yes Diagnosis Code: Personal History of Nicotine Dependence. B35.329 Information about tobacco cessation classes and interventions provided to patient. Yes Patient provided with "ticket" for LDCT Scan. yes Written Order for Lung Cancer Screening with LDCT placed in Epic. Yes (CT Chest Lung Cancer Screening Low Dose W/O CM) [id number] Z12.2-Screening of respiratory organs Z87.891-Personal history of nicotine dependence  I have spent 25 minutes of face to face/ virtual visit   time with  [name] discussing the risks and benefits of lung cancer screening. We viewed / discussed a power point together that explained in detail the above noted topics. We paused at intervals to allow for questions to be asked and answered to ensure understanding.We discussed that the single most powerful action that she can take to decrease her risk of developing lung cancer is to quit smoking. We discussed whether or not she is ready to commit to setting a quit date. We discussed options for tools to aid in quitting smoking including nicotine replacement therapy, non-nicotine medications, support groups, Quit Smart classes, and behavior modification. We discussed that often times setting smaller, more achievable goals, such as eliminating 1 cigarette a day for a week and then 2 cigarettes a day for a week can be helpful in slowly decreasing the number of cigarettes smoked. This allows for a sense of accomplishment as well as providing a clinical benefit. I provided  her  with smoking cessation  information  with contact information for  community resources, classes, free nicotine replacement  therapy, and access to mobile apps, text messaging, and on-line smoking cessation help. I have also provided  her  the office contact information in the event she needs to contact me, or the screening staff. We discussed the time and location of the scan, and that either Doroteo Glassman RN, Joella Prince, RN  or I will call / send a letter with the results within 24-72 hours of receiving them. The patient verbalized understanding of all of  the above and had no further questions upon leaving the office. They have my contact information in the event they have any further questions.  I spent 3 minutes counseling on smoking cessation and the health risks of continued tobacco abuse.  I explained to the patient that there has been a high incidence of coronary artery disease noted on these exams. I explained that this is a non-gated exam therefore degree or severity cannot be determined. This patient is noton statin therapy. I have asked the patient to follow-up with their PCP regarding any incidental finding of coronary artery disease and management with diet or medication as their PCP  feels is clinically indicated. The patient verbalized understanding of the above and had no further questions upon completion of the visit.      Magdalen Spatz, NP 09/29/2022

## 2022-09-29 NOTE — Patient Instructions (Signed)
Thank you for participating in the [place]. It was our pleasure to meet you today. We will call you with the results of your scan within the next few days. Your scan will be assigned a Lung RADS category score by the physicians reading the scans.  This Lung RADS score determines follow up scanning.  See below for description of categories, and follow up screening recommendations. We will be in touch to schedule your follow up screening annually or based on recommendations of our providers. We will fax a copy of your scan results to your Primary Care Physician, or the physician who referred you to the program, to ensure they have the results. Please call the office if you have any questions or concerns regarding your scanning experience or results.  Our office number is [phone]. Please speak with Denise Phelps, RN. , or  Denise Buckner RN, They are  our Lung Cancer Screening RN.'s If They are unavailable when you call, Please leave a message on the voice mail. We will return your call at our earliest convenience.This voice mail is monitored several times a day.  Remember, if your scan is normal, we will scan you annually as long as you continue to meet the criteria for the program. (Age 55-77, Current smoker or smoker who has quit within the last 15 years). If you are a smoker, remember, quitting is the single most powerful action that you can take to decrease your risk of lung cancer and other pulmonary, breathing related problems. We know quitting is hard, and we are here to help.  Please let us know if there is anything we can do to help you meet your goal of quitting. If you are a former smoker, congratulations. We are proud of you! Remain smoke free! Remember you can refer friends or family members through the number above.  We will screen them to make sure they meet criteria for the program. Thank you for helping us take better care of you by  participating in Lung Screening.  You can receive free nicotine replacement therapy ( patches, gum or mints) by calling 1-800-QUIT NOW. Please call so we can get you on the path to becoming  a non-smoker. I know it is hard, but you can do this!  Lung RADS Categories:  Lung RADS 1: no nodules or definitely non-concerning nodules.  Recommendation is for a repeat annual scan in 12 months.  Lung RADS 2:  nodules that are non-concerning in appearance and behavior with a very low likelihood of becoming an active cancer. Recommendation is for a repeat annual scan in 12 months.  Lung RADS 3: nodules that are probably non-concerning , includes nodules with a low likelihood of becoming an active cancer.  Recommendation is for a 6-month repeat screening scan. Often noted after an upper respiratory illness. We will be in touch to make sure you have no questions, and to schedule your 6-month scan.  Lung RADS 4 A: nodules with concerning findings, recommendation is most often for a follow up scan in 3 months or additional testing based on our provider's assessment of the scan. We will be in touch to make sure you have no questions and to schedule the recommended 3 month follow up scan.  Lung RADS 4 B:  indicates findings that are concerning. We will be in touch with you to schedule additional diagnostic testing based on our provider's  assessment of the scan.  Other options for assistance in smoking cessation (   As covered by your insurance benefits)  Hypnosis for smoking cessation  [employer] [phone]  Acupuncture for smoking cessation  [facility] [phone]   

## 2022-09-30 ENCOUNTER — Ambulatory Visit
Admission: RE | Admit: 2022-09-30 | Discharge: 2022-09-30 | Disposition: A | Discharge status: Home / Self Care | Payer: 59 | Source: Ambulatory Visit | Attending: Internal Medicine | Admitting: Internal Medicine

## 2022-09-30 DIAGNOSIS — Z87891 Personal history of nicotine dependence: Secondary | ICD-10-CM

## 2022-09-30 DIAGNOSIS — F17210 Nicotine dependence, cigarettes, uncomplicated: Secondary | ICD-10-CM

## 2022-09-30 DIAGNOSIS — Z122 Encounter for screening for malignant neoplasm of respiratory organs: Secondary | ICD-10-CM

## 2022-10-04 ENCOUNTER — Telehealth: Payer: Self-pay | Admitting: *Deleted

## 2022-10-04 DIAGNOSIS — Z87891 Personal history of nicotine dependence: Secondary | ICD-10-CM

## 2022-10-04 DIAGNOSIS — R911 Solitary pulmonary nodule: Secondary | ICD-10-CM

## 2022-10-04 NOTE — Telephone Encounter (Signed)
Dr Patsey Berthold reviewed lung cancer screening CT dated 09/30/2022 and recommends a 6 month follow up nodule follow up LCS CT.   Left message for patient to call back review CT results.

## 2022-10-05 NOTE — Telephone Encounter (Signed)
Spoke with pt and advised of CT results per Dr Patsey Berthold. PT is aware that we will repeat the Chest Ct in 6 months and will call her closer to that time to schedule. Copy of report faxed to PCP with f/u plans included. Order placed for 6 mth nodule f/u CT.

## 2022-10-05 NOTE — Telephone Encounter (Signed)
Left message for pt to call back to discuss lung screening results.

## 2023-02-25 DIAGNOSIS — S42009A Fracture of unspecified part of unspecified clavicle, initial encounter for closed fracture: Secondary | ICD-10-CM

## 2023-02-25 HISTORY — DX: Fracture of unspecified part of unspecified clavicle, initial encounter for closed fracture: S42.009A

## 2023-03-04 ENCOUNTER — Inpatient Hospital Stay: Payer: 59 | Attending: Oncology

## 2023-03-10 ENCOUNTER — Telehealth: Payer: Self-pay | Admitting: Oncology

## 2023-03-10 NOTE — Telephone Encounter (Signed)
[facility] ES pt scheduled for MD tomorrow. She no showed to labs last week. Will need to r/s next avail: labs 1 week prior to MD. LVm with new appt information

## 2023-03-11 ENCOUNTER — Inpatient Hospital Stay: Payer: 59 | Admitting: Oncology

## 2023-03-18 ENCOUNTER — Other Ambulatory Visit: Payer: Self-pay

## 2023-03-18 ENCOUNTER — Emergency Department: Payer: 59

## 2023-03-18 ENCOUNTER — Emergency Department
Admission: EM | Admit: 2023-03-18 | Discharge: 2023-03-18 | Disposition: A | Discharge status: Home / Self Care | Payer: 59 | Attending: Emergency Medicine | Admitting: Emergency Medicine

## 2023-03-18 DIAGNOSIS — S301XXA Contusion of abdominal wall, initial encounter: Secondary | ICD-10-CM | POA: Diagnosis not present

## 2023-03-18 DIAGNOSIS — S299XXA Unspecified injury of thorax, initial encounter: Secondary | ICD-10-CM | POA: Diagnosis present

## 2023-03-18 DIAGNOSIS — Y92410 Unspecified street and highway as the place of occurrence of the external cause: Secondary | ICD-10-CM | POA: Diagnosis not present

## 2023-03-18 DIAGNOSIS — S62323A Displaced fracture of shaft of third metacarpal bone, left hand, initial encounter for closed fracture: Secondary | ICD-10-CM | POA: Insufficient documentation

## 2023-03-18 DIAGNOSIS — Z8541 Personal history of malignant neoplasm of cervix uteri: Secondary | ICD-10-CM | POA: Insufficient documentation

## 2023-03-18 DIAGNOSIS — J449 Chronic obstructive pulmonary disease, unspecified: Secondary | ICD-10-CM | POA: Diagnosis not present

## 2023-03-18 DIAGNOSIS — S62325A Displaced fracture of shaft of fourth metacarpal bone, left hand, initial encounter for closed fracture: Secondary | ICD-10-CM | POA: Insufficient documentation

## 2023-03-18 DIAGNOSIS — S0990XA Unspecified injury of head, initial encounter: Secondary | ICD-10-CM | POA: Diagnosis not present

## 2023-03-18 DIAGNOSIS — S20312A Abrasion of left front wall of thorax, initial encounter: Secondary | ICD-10-CM | POA: Insufficient documentation

## 2023-03-18 DIAGNOSIS — S2231XA Fracture of one rib, right side, initial encounter for closed fracture: Secondary | ICD-10-CM | POA: Diagnosis not present

## 2023-03-18 DIAGNOSIS — S62329A Displaced fracture of shaft of unspecified metacarpal bone, initial encounter for closed fracture: Secondary | ICD-10-CM

## 2023-03-18 DIAGNOSIS — J45909 Unspecified asthma, uncomplicated: Secondary | ICD-10-CM | POA: Insufficient documentation

## 2023-03-18 DIAGNOSIS — M25512 Pain in left shoulder: Secondary | ICD-10-CM | POA: Insufficient documentation

## 2023-03-18 LAB — CBC WITH DIFFERENTIAL/PLATELET
Abs Immature Granulocytes: 0.12 10*3/uL — ABNORMAL HIGH (ref 0.00–0.07)
Basophils Absolute: 0.1 10*3/uL (ref 0.0–0.1)
Basophils Relative: 0 %
Eosinophils Absolute: 0 10*3/uL (ref 0.0–0.5)
Eosinophils Relative: 0 %
HCT: 41.4 % (ref 36.0–46.0)
Hemoglobin: 13.5 g/dL (ref 12.0–15.0)
Immature Granulocytes: 1 %
Lymphocytes Relative: 13 %
Lymphs Abs: 2.3 10*3/uL (ref 0.7–4.0)
MCH: 32.4 pg (ref 26.0–34.0)
MCHC: 32.6 g/dL (ref 30.0–36.0)
MCV: 99.3 fL (ref 80.0–100.0)
Monocytes Absolute: 1.6 10*3/uL — ABNORMAL HIGH (ref 0.1–1.0)
Monocytes Relative: 9 %
Neutro Abs: 14 10*3/uL — ABNORMAL HIGH (ref 1.7–7.7)
Neutrophils Relative %: 77 %
Platelets: 277 10*3/uL (ref 150–400)
RBC: 4.17 MIL/uL (ref 3.87–5.11)
RDW: 14.2 % (ref 11.5–15.5)
WBC: 18.1 10*3/uL — ABNORMAL HIGH (ref 4.0–10.5)
nRBC: 0 % (ref 0.0–0.2)

## 2023-03-18 LAB — COMPREHENSIVE METABOLIC PANEL
ALT: 21 U/L (ref 0–44)
AST: 34 U/L (ref 15–41)
Albumin: 3.9 g/dL (ref 3.5–5.0)
Alkaline Phosphatase: 53 U/L (ref 38–126)
Anion gap: 10 (ref 5–15)
BUN: 10 mg/dL (ref 8–23)
CO2: 26 mmol/L (ref 22–32)
Calcium: 8.9 mg/dL (ref 8.9–10.3)
Chloride: 104 mmol/L (ref 98–111)
Creatinine, Ser: 1.04 mg/dL — ABNORMAL HIGH (ref 0.44–1.00)
GFR, Estimated: >60 mL/min (ref >60)
Glucose, Bld: 115 mg/dL — ABNORMAL HIGH (ref 70–99)
Potassium: 3.1 mmol/L — ABNORMAL LOW (ref 3.5–5.1)
Sodium: 140 mmol/L (ref 135–145)
Total Bilirubin: 0.7 mg/dL (ref 0.3–1.2)
Total Protein: 6.4 g/dL — ABNORMAL LOW (ref 6.5–8.1)

## 2023-03-18 MED ORDER — OXYCODONE-ACETAMINOPHEN 5-325 MG PO TABS: 1.0000 | ORAL_TABLET | ORAL | 0 refills | Status: DC | PRN

## 2023-03-18 MED ORDER — MORPHINE SULFATE (PF) 4 MG/ML IV SOLN
4.0000 mg | Freq: Once | INTRAVENOUS | Status: AC
  Administered 2023-03-18: 4 mg via INTRAVENOUS
  Filled 2023-03-18: qty 1

## 2023-03-18 MED ORDER — POTASSIUM CHLORIDE CRYS ER 20 MEQ PO TBCR
40.0000 meq | EXTENDED_RELEASE_TABLET | Freq: Once | ORAL | Status: AC
  Administered 2023-03-18: 40 meq via ORAL
  Filled 2023-03-18: qty 2

## 2023-03-18 MED ORDER — OXYCODONE-ACETAMINOPHEN 5-325 MG PO TABS
1.0000 | ORAL_TABLET | Freq: Once | ORAL | Status: AC
  Administered 2023-03-18: 1 via ORAL
  Filled 2023-03-18: qty 1

## 2023-03-18 MED ORDER — LIDOCAINE 5 % EX PTCH: 1.0000 | MEDICATED_PATCH | Freq: Two times a day (BID) | CUTANEOUS | 0 refills | Status: DC

## 2023-03-18 MED ORDER — LIDOCAINE 5 % EX PTCH
1.0000 | MEDICATED_PATCH | CUTANEOUS | Status: DC
  Administered 2023-03-18: 1 via TRANSDERMAL
  Filled 2023-03-18: qty 1

## 2023-03-18 MED ORDER — IOHEXOL 300 MG/ML  SOLN
100.0000 mL | Freq: Once | INTRAMUSCULAR | Status: AC | PRN
  Administered 2023-03-18: 100 mL via INTRAVENOUS

## 2023-03-18 MED ORDER — ONDANSETRON 4 MG PO TBDP: 4.0000 mg | ORAL_TABLET | Freq: Three times a day (TID) | ORAL | 0 refills | Status: AC | PRN

## 2023-03-18 MED ORDER — ONDANSETRON 4 MG PO TBDP
4.0000 mg | ORAL_TABLET | Freq: Once | ORAL | Status: AC
  Administered 2023-03-18: 4 mg via ORAL
  Filled 2023-03-18: qty 1
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
## 2023-03-18 NOTE — ED Notes (Signed)
Report given to Kacey RN
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
## 2023-04-01 ENCOUNTER — Ambulatory Visit
Admission: RE | Admit: 2023-04-01 | Discharge: 2023-04-01 | Disposition: A | Discharge status: Home / Self Care | Payer: 59 | Source: Ambulatory Visit | Attending: Acute Care | Admitting: Acute Care

## 2023-04-01 DIAGNOSIS — Z87891 Personal history of nicotine dependence: Secondary | ICD-10-CM

## 2023-04-01 DIAGNOSIS — R911 Solitary pulmonary nodule: Secondary | ICD-10-CM

## 2023-04-04 ENCOUNTER — Telehealth: Payer: Self-pay | Admitting: Acute Care

## 2023-04-04 ENCOUNTER — Other Ambulatory Visit: Payer: Self-pay

## 2023-04-04 DIAGNOSIS — Z87891 Personal history of nicotine dependence: Secondary | ICD-10-CM

## 2023-04-04 DIAGNOSIS — R911 Solitary pulmonary nodule: Secondary | ICD-10-CM

## 2023-04-04 DIAGNOSIS — F17210 Nicotine dependence, cigarettes, uncomplicated: Secondary | ICD-10-CM
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
## 2023-04-22 ENCOUNTER — Inpatient Hospital Stay: Payer: 59 | Attending: Oncology

## 2023-04-22 DIAGNOSIS — D72829 Elevated white blood cell count, unspecified: Secondary | ICD-10-CM | POA: Diagnosis present

## 2023-04-22 DIAGNOSIS — F17210 Nicotine dependence, cigarettes, uncomplicated: Secondary | ICD-10-CM | POA: Diagnosis not present

## 2023-04-22 LAB — CBC WITH DIFFERENTIAL/PLATELET
Abs Immature Granulocytes: 0.02 10*3/uL (ref 0.00–0.07)
Basophils Absolute: 0.1 10*3/uL (ref 0.0–0.1)
Basophils Relative: 1 %
Eosinophils Absolute: 0.1 10*3/uL (ref 0.0–0.5)
Eosinophils Relative: 2 %
HCT: 41.2 % (ref 36.0–46.0)
Hemoglobin: 13.4 g/dL (ref 12.0–15.0)
Immature Granulocytes: 0 %
Lymphocytes Relative: 43 %
Lymphs Abs: 3.8 10*3/uL (ref 0.7–4.0)
MCH: 32.3 pg (ref 26.0–34.0)
MCHC: 32.5 g/dL (ref 30.0–36.0)
MCV: 99.3 fL (ref 80.0–100.0)
Monocytes Absolute: 0.7 10*3/uL (ref 0.1–1.0)
Monocytes Relative: 8 %
Neutro Abs: 4.1 10*3/uL (ref 1.7–7.7)
Neutrophils Relative %: 46 %
Platelets: 276 10*3/uL (ref 150–400)
RBC: 4.15 MIL/uL (ref 3.87–5.11)
RDW: 13.5 % (ref 11.5–15.5)
WBC: 8.8 10*3/uL (ref 4.0–10.5)
nRBC: 0 % (ref 0.0–0.2)

## 2023-04-22 LAB — COMPREHENSIVE METABOLIC PANEL
ALT: 13 U/L (ref 0–44)
AST: 16 U/L (ref 15–41)
Albumin: 3.9 g/dL (ref 3.5–5.0)
Alkaline Phosphatase: 67 U/L (ref 38–126)
Anion gap: 7 (ref 5–15)
BUN: 17 mg/dL (ref 8–23)
CO2: 23 mmol/L (ref 22–32)
Calcium: 9 mg/dL (ref 8.9–10.3)
Chloride: 109 mmol/L (ref 98–111)
Creatinine, Ser: 1.08 mg/dL — ABNORMAL HIGH (ref 0.44–1.00)
GFR, Estimated: 58 mL/min — ABNORMAL LOW (ref >60)
Glucose, Bld: 104 mg/dL — ABNORMAL HIGH (ref 70–99)
Potassium: 3.8 mmol/L (ref 3.5–5.1)
Sodium: 139 mmol/L (ref 135–145)
Total Bilirubin: 0.5 mg/dL (ref 0.3–1.2)
Total Protein: 6.7 g/dL (ref 6.5–8.1)

## 2023-04-22 LAB — LACTATE DEHYDROGENASE: LDH: 124 U/L (ref 98–192)

## 2023-04-25 LAB — COMP PANEL: LEUKEMIA/LYMPHOMA

## 2023-04-26 ENCOUNTER — Encounter: Payer: Self-pay | Admitting: Occupational Therapy

## 2023-04-26 ENCOUNTER — Ambulatory Visit: Payer: 59 | Attending: Orthopedic Surgery | Admitting: Occupational Therapy

## 2023-04-26 DIAGNOSIS — M79642 Pain in left hand: Secondary | ICD-10-CM | POA: Insufficient documentation

## 2023-04-26 DIAGNOSIS — M25642 Stiffness of left hand, not elsewhere classified: Secondary | ICD-10-CM

## 2023-04-26 DIAGNOSIS — M6281 Muscle weakness (generalized): Secondary | ICD-10-CM

## 2023-04-26 NOTE — Addendum Note (Signed)
Addended by: Oletta Cohn on: 04/26/2023 09:42 AM   Modules accepted: Orders
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
## 2023-04-29 ENCOUNTER — Encounter: Payer: Self-pay | Admitting: Oncology

## 2023-04-29 ENCOUNTER — Inpatient Hospital Stay: Payer: 59 | Attending: Oncology | Admitting: Oncology

## 2023-04-29 VITALS — BP 143/88 | HR 78 | Temp 97.0°F | Resp 18 | Wt 157.9 lb

## 2023-04-29 DIAGNOSIS — Z720 Tobacco use: Secondary | ICD-10-CM

## 2023-04-29 DIAGNOSIS — D72829 Elevated white blood cell count, unspecified: Secondary | ICD-10-CM | POA: Insufficient documentation

## 2023-04-29 DIAGNOSIS — F17210 Nicotine dependence, cigarettes, uncomplicated: Secondary | ICD-10-CM | POA: Insufficient documentation

## 2023-04-29 DIAGNOSIS — Z79899 Other long term (current) drug therapy: Secondary | ICD-10-CM | POA: Diagnosis not present

## 2023-04-29 DIAGNOSIS — Z801 Family history of malignant neoplasm of trachea, bronchus and lung: Secondary | ICD-10-CM | POA: Diagnosis not present

## 2023-04-29 NOTE — Progress Notes (Signed)
Patient here for oncology follow-up appointment, concerns of pain from hand fracture

## 2023-04-29 NOTE — Assessment & Plan Note (Signed)
Patient has about 63-pack-year smoking history.  She has established with lung cancer screening program.

## 2023-04-29 NOTE — Progress Notes (Signed)
[facility] CONSULT NOTE  Patient Care Team: Barbette Reichmann, MD as PCP - General (Internal Medicine) Rickard Patience, MD as Consulting Physician (Oncology)  ASSESSMENT & PLAN  Leukocytosis Labs are reviewed and discussed with patient. SPEP showed negative M protein, light chain ratio is normal.  Slightly elevated free kappa level.  Peripheral Repeat Peripheral blood flowcytometry showed no immunophenotypic abnormality.  No leukocytosis.  I will hold off additional work up   Tobacco use Patient has about 63-pack-year smoking history.  She has established with lung cancer screening program.  Patient is discharged from my clinic. I recommend patient to continue follow up with primary care physician. Patient may re-establish care in the future if clinically indicated.  Rickard Patience, MD, PhD [facility] Hematology Oncology 04/29/2023       CHIEF COMPLAINTS/PURPOSE OF CONSULTATION:  Leukocytosis/elevate white count  HISTORY OF PRESENTING ILLNESS:  Connie Anderson 62 y.o. female is here because of elevated WBC.  She was found to have abnormal CBC on 07/23/2022, total white count 12.2, predominantly lymphocytosis with absolute lymphocytes 6.11.  Increased lymphocyte percentage 50.3%. Patient is off cigarettes.  She has smoked since she was 62 years of age, 1.5 packs daily on average for 42 years.  Starting 3 years ago, she has cut down to currently 4 cigarettes/day.  She works in the cigarette company  She denies recent infection.  There is not reported symptoms of sinus congestion, cough, urinary frequency/urgency or dysuria, diarrhea, joint swelling/pain or abnormal skin rash.   Her age appropriate screening programs are up-to-date. The patient has no prior diagnosis of autoimmune disease and was not prescribed corticosteroids related products.  INTERVAL HISTORY Connie Anderson is a 62 y.o. female who has above history reviewed by me today presents for follow up for  leukocytosis Patient has no new complaints.  MEDICAL HISTORY:  Past Medical History:  Diagnosis Date   Asthma    Cancer (HCC)    cervical   COPD (chronic obstructive pulmonary disease) (HCC)     SURGICAL HISTORY: Past Surgical History:  Procedure Laterality Date   ABDOMINAL HYSTERECTOMY      SOCIAL HISTORY: Social History   Socioeconomic History   Marital status: Divorced    Spouse name: Not on file   Number of children: Not on file   Years of education: Not on file   Highest education level: Not on file  Occupational History   Not on file  Tobacco Use   Smoking status: Every Day    Packs/day: 1.40    Years: 45.00    Additional pack years: 0.00    Total pack years: 63.00    Types: Cigarettes   Smokeless tobacco: Never  Substance and Sexual Activity   Alcohol use: Not Currently   Drug use: Not Currently   Sexual activity: Not Currently  Other Topics Concern   Not on file  Social History Narrative   Not on file   Social Determinants of Health   Financial Resource Strain: Not on file  Food Insecurity: Not on file  Transportation Needs: Not on file  Physical Activity: Not on file  Stress: Not on file  Social Connections: Not on file  Intimate Partner Violence: Not on file    FAMILY HISTORY: Family History  Problem Relation Age of Onset   Dementia Mother    Diabetes Mother    Lung cancer Father    Heart disease Maternal Grandmother    Diabetes Maternal Grandfather    Breast cancer  Neg Hx     ALLERGIES:  is allergic to tetracycline.  MEDICATIONS:  Current Outpatient Medications  Medication Sig Dispense Refill   ADVAIR DISKUS 100-50 MCG/ACT AEPB Inhale 1 puff into the lungs 2 (two) times daily.     albuterol (VENTOLIN HFA) 108 (90 Base) MCG/ACT inhaler Inhale into the lungs.     buPROPion (WELLBUTRIN XL) 300 MG 24 hr tablet Take by mouth.     lidocaine (LIDODERM) 5 % Place 1 patch onto the skin every 12 (twelve) hours. Remove & Discard patch within  12 hours or as directed by MD 10 patch 0   montelukast (SINGULAIR) 10 MG tablet Take 10 mg by mouth daily.     ondansetron (ZOFRAN-ODT) 4 MG disintegrating tablet Take 1 tablet (4 mg total) by mouth every 8 (eight) hours as needed for nausea or vomiting. 30 tablet 0   oxyCODONE-acetaminophen (PERCOCET) 5-325 MG tablet Take 1 tablet by mouth every 4 (four) hours as needed for severe pain. 12 tablet 0   rizatriptan (MAXALT-MLT) 10 MG disintegrating tablet Take by mouth.     Vitamin D, Ergocalciferol, (DRISDOL) 1.25 MG (50000 UNIT) CAPS capsule Take 50,000 Units by mouth once a week.     No current facility-administered medications for this visit.    Review of Systems  Constitutional:  Negative for appetite change, chills, fatigue and fever.  HENT:   Negative for hearing loss and voice change.   Eyes:  Negative for eye problems.  Respiratory:  Negative for chest tightness and cough.   Cardiovascular:  Negative for chest pain.  Gastrointestinal:  Negative for abdominal distention, abdominal pain and blood in stool.  Endocrine: Negative for hot flashes.  Genitourinary:  Negative for difficulty urinating and frequency.   Musculoskeletal:  Negative for arthralgias.  Skin:  Negative for itching and rash.  Neurological:  Negative for extremity weakness.  Hematological:  Negative for adenopathy.  Psychiatric/Behavioral:  Negative for confusion.      PHYSICAL EXAMINATION: ECOG PERFORMANCE STATUS: 0 - Asymptomatic  Vitals:   04/29/23 1040  BP: (!) 143/88  Pulse: 78  Resp: 18  Temp: (!) 97 F (36.1 C)  SpO2: 100%   Filed Weights   04/29/23 1040  Weight: 157 lb 14.4 oz (71.6 kg)    Physical Exam Constitutional:      General: She is not in acute distress.    Appearance: She is not diaphoretic.  HENT:     Head: Normocephalic and atraumatic.     Nose: Nose normal.     Mouth/Throat:     Pharynx: No oropharyngeal exudate.  Eyes:     General: No scleral icterus.    Pupils: Pupils  are equal, round, and reactive to light.  Cardiovascular:     Rate and Rhythm: Normal rate and regular rhythm.     Heart sounds: No murmur heard. Pulmonary:     Effort: Pulmonary effort is normal. No respiratory distress.     Comments: Decreased breath sound bilaterally Abdominal:     General: There is no distension.     Palpations: Abdomen is soft.     Tenderness: There is no abdominal tenderness.  Musculoskeletal:        General: Normal range of motion.     Cervical back: Normal range of motion and neck supple.  Skin:    General: Skin is warm and dry.     Findings: No erythema.  Neurological:     Mental Status: She is alert and oriented to person, place,  and time.     Cranial Nerves: No cranial nerve deficit.     Motor: No abnormal muscle tone.     Coordination: Coordination normal.  Psychiatric:        Mood and Affect: Affect normal.     LABORATORY DATA:  I have reviewed the data as listed     Latest Ref Rng & Units 04/22/2023    1:07 PM 03/18/2023   11:15 AM 08/10/2022   10:07 AM  CBC  WBC 4.0 - 10.5 K/uL 8.8  18.1  8.7   Hemoglobin 12.0 - 15.0 g/dL 09.8  11.9  14.7   Hematocrit 36.0 - 46.0 % 41.2  41.4  41.7   Platelets 150 - 400 K/uL 276  277  391       Latest Ref Rng & Units 04/22/2023    1:07 PM 03/18/2023   12:56 PM 08/10/2022   10:07 AM  CMP  Glucose 70 - 99 mg/dL 829  562  130   BUN 8 - 23 mg/dL 17  10  14    Creatinine 0.44 - 1.00 mg/dL 8.65  7.84  6.96   Sodium 135 - 145 mmol/L 139  140  139   Potassium 3.5 - 5.1 mmol/L 3.8  3.1  3.8   Chloride 98 - 111 mmol/L 109  104  106   CO2 22 - 32 mmol/L 23  26  26    Calcium 8.9 - 10.3 mg/dL 9.0  8.9  9.1   Total Protein 6.5 - 8.1 g/dL 6.7  6.4  7.7   Total Bilirubin 0.3 - 1.2 mg/dL 0.5  0.7  0.5   Alkaline Phos 38 - 126 U/L 67  53  62   AST 15 - 41 U/L 16  34  20   ALT 0 - 44 U/L 13  21  14         RADIOGRAPHIC STUDIES: I have personally reviewed the radiological images as listed and agreed with the  findings in the report. CT CHEST LCS NODULE F/U LOW DOSE WO CONTRAST  Result Date: 04/01/2023 CLINICAL DATA:  Lung cancer screening. Current smoker with 45 pack-year history. Asymptomatic. EXAM: CT CHEST WITHOUT CONTRAST FOR LUNG CANCER SCREENING NODULE FOLLOW-UP TECHNIQUE: Multidetector CT imaging of the chest was performed following the standard protocol without IV contrast. RADIATION DOSE REDUCTION: This exam was performed according to the departmental dose-optimization program which includes automated exposure control, adjustment of the mA and/or kV according to patient size and/or use of iterative reconstruction technique. COMPARISON:  LDCT 09/30/2022 and CT chest, abdomen and pelvis 03/18/2023 FINDINGS: Cardiovascular: Normal heart size. Aortic atherosclerosis. No pericardial effusion. Mediastinum/Nodes: No enlarged mediastinal or axillary lymph nodes. Thyroid gland, trachea, and esophagus demonstrate no significant findings. Lungs/Pleura: Centrilobular emphysema scarring noted within the posterior right base. No pleural effusion or consolidation. Stable appearance of indistinct sub solid nodule in the basilar right lower lobe. This has a mean derived diameter of 11.4 mm. Previously 11.6 mm. There is a new subpleural nodule within the posterior right lower lobe which has a mean derived diameter of 4.2 mm. Upper Abdomen: No acute abnormality. Musculoskeletal: Recent anterior right 6th rib fracture is unchanged from previous exam, image 32/2. No suspicious bone lesions. Resolving contusion identified within the medial aspect of the right breast, image 30/2. IMPRESSION: 1. Lung-RADS 3, probably benign findings. Short-term follow-up in 6 months is recommended with repeat low-dose chest CT without contrast (please use the following order, "CT CHEST LCS NODULE FOLLOW-UP W/O CM"). 2.  Aortic Atherosclerosis (ICD10-I70.0) and Emphysema (ICD10-J43.9). Electronically Signed   By: Signa Kell M.D.   On: 04/01/2023  12:38

## 2023-04-29 NOTE — Assessment & Plan Note (Addendum)
Labs are reviewed and discussed with patient. SPEP showed negative M protein, light chain ratio is normal.  Slightly elevated free kappa level.  Peripheral Repeat Peripheral blood flowcytometry showed no immunophenotypic abnormality.  No leukocytosis.  I will hold off additional work up

## 2023-05-03 ENCOUNTER — Ambulatory Visit: Payer: 59 | Attending: Orthopedic Surgery | Admitting: Occupational Therapy

## 2023-05-03 ENCOUNTER — Encounter: Payer: Self-pay | Admitting: Occupational Therapy

## 2023-05-03 DIAGNOSIS — M25642 Stiffness of left hand, not elsewhere classified: Secondary | ICD-10-CM | POA: Insufficient documentation

## 2023-05-03 DIAGNOSIS — M6281 Muscle weakness (generalized): Secondary | ICD-10-CM | POA: Insufficient documentation

## 2023-05-03 DIAGNOSIS — M79642 Pain in left hand: Secondary | ICD-10-CM | POA: Insufficient documentation
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
## 2023-05-05 ENCOUNTER — Ambulatory Visit: Payer: 59 | Admitting: Occupational Therapy

## 2023-05-05 DIAGNOSIS — M79642 Pain in left hand: Secondary | ICD-10-CM

## 2023-05-05 DIAGNOSIS — M25642 Stiffness of left hand, not elsewhere classified: Secondary | ICD-10-CM

## 2023-05-05 DIAGNOSIS — M6281 Muscle weakness (generalized): Secondary | ICD-10-CM
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
## 2023-05-10 ENCOUNTER — Ambulatory Visit: Payer: 59 | Admitting: Occupational Therapy

## 2023-05-12 ENCOUNTER — Ambulatory Visit: Payer: 59 | Admitting: Occupational Therapy

## 2023-05-12 DIAGNOSIS — M25642 Stiffness of left hand, not elsewhere classified: Secondary | ICD-10-CM

## 2023-05-12 DIAGNOSIS — M79642 Pain in left hand: Secondary | ICD-10-CM | POA: Diagnosis not present

## 2023-05-12 DIAGNOSIS — M6281 Muscle weakness (generalized): Secondary | ICD-10-CM
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
## 2023-05-17 ENCOUNTER — Ambulatory Visit: Payer: 59 | Admitting: Occupational Therapy

## 2023-05-17 DIAGNOSIS — M6281 Muscle weakness (generalized): Secondary | ICD-10-CM

## 2023-05-17 DIAGNOSIS — M79642 Pain in left hand: Secondary | ICD-10-CM | POA: Diagnosis not present

## 2023-05-17 DIAGNOSIS — M25642 Stiffness of left hand, not elsewhere classified: Secondary | ICD-10-CM
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
## 2023-05-19 ENCOUNTER — Ambulatory Visit: Payer: 59 | Admitting: Occupational Therapy

## 2023-05-19 DIAGNOSIS — M6281 Muscle weakness (generalized): Secondary | ICD-10-CM

## 2023-05-19 DIAGNOSIS — M79642 Pain in left hand: Secondary | ICD-10-CM | POA: Diagnosis not present

## 2023-05-19 DIAGNOSIS — M25642 Stiffness of left hand, not elsewhere classified: Secondary | ICD-10-CM
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
## 2023-05-26 ENCOUNTER — Ambulatory Visit: Payer: 59 | Admitting: Occupational Therapy

## 2023-05-26 DIAGNOSIS — M25642 Stiffness of left hand, not elsewhere classified: Secondary | ICD-10-CM

## 2023-05-26 DIAGNOSIS — M79642 Pain in left hand: Secondary | ICD-10-CM | POA: Diagnosis not present

## 2023-05-26 DIAGNOSIS — M6281 Muscle weakness (generalized): Secondary | ICD-10-CM
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
## 2023-06-02 ENCOUNTER — Ambulatory Visit: Payer: 59 | Attending: Orthopedic Surgery | Admitting: Occupational Therapy

## 2023-06-02 DIAGNOSIS — M6281 Muscle weakness (generalized): Secondary | ICD-10-CM | POA: Insufficient documentation

## 2023-06-02 DIAGNOSIS — M79642 Pain in left hand: Secondary | ICD-10-CM | POA: Insufficient documentation

## 2023-06-02 DIAGNOSIS — M25642 Stiffness of left hand, not elsewhere classified: Secondary | ICD-10-CM | POA: Insufficient documentation
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
## 2023-06-09 ENCOUNTER — Ambulatory Visit: Payer: 59 | Admitting: Occupational Therapy

## 2023-06-09 DIAGNOSIS — M6281 Muscle weakness (generalized): Secondary | ICD-10-CM

## 2023-06-09 DIAGNOSIS — M79642 Pain in left hand: Secondary | ICD-10-CM
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
## 2023-06-14 ENCOUNTER — Ambulatory Visit: Payer: 59 | Admitting: Occupational Therapy

## 2023-06-14 DIAGNOSIS — M6281 Muscle weakness (generalized): Secondary | ICD-10-CM

## 2023-06-14 DIAGNOSIS — M25642 Stiffness of left hand, not elsewhere classified: Secondary | ICD-10-CM

## 2023-06-14 DIAGNOSIS — M79642 Pain in left hand: Secondary | ICD-10-CM
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
## 2023-06-20 ENCOUNTER — Ambulatory Visit: Payer: 59 | Admitting: Occupational Therapy

## 2023-06-20 DIAGNOSIS — M79642 Pain in left hand: Secondary | ICD-10-CM | POA: Diagnosis not present

## 2023-06-20 DIAGNOSIS — M25642 Stiffness of left hand, not elsewhere classified: Secondary | ICD-10-CM

## 2023-06-20 DIAGNOSIS — M6281 Muscle weakness (generalized): Secondary | ICD-10-CM
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
## 2023-07-04 ENCOUNTER — Ambulatory Visit: Payer: 59 | Admitting: Occupational Therapy

## 2023-07-07 ENCOUNTER — Ambulatory Visit: Payer: 59 | Attending: Orthopedic Surgery | Admitting: Occupational Therapy

## 2023-07-07 DIAGNOSIS — M6281 Muscle weakness (generalized): Secondary | ICD-10-CM

## 2023-07-07 DIAGNOSIS — M25642 Stiffness of left hand, not elsewhere classified: Secondary | ICD-10-CM

## 2023-07-07 DIAGNOSIS — M79642 Pain in left hand: Secondary | ICD-10-CM
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
## 2023-07-12 ENCOUNTER — Other Ambulatory Visit: Payer: Self-pay | Admitting: Internal Medicine

## 2023-07-12 DIAGNOSIS — Z1231 Encounter for screening mammogram for malignant neoplasm of breast: Secondary | ICD-10-CM

## 2023-08-17 ENCOUNTER — Other Ambulatory Visit: Payer: Self-pay | Admitting: Surgery

## 2023-08-18 ENCOUNTER — Encounter
Admission: RE | Admit: 2023-08-18 | Discharge: 2023-08-18 | Disposition: A | Discharge status: Home / Self Care | Payer: 59 | Source: Ambulatory Visit | Attending: Surgery | Admitting: Surgery

## 2023-08-18 HISTORY — DX: Anxiety disorder, unspecified: F41.9

## 2023-08-18 HISTORY — DX: Personal history of other diseases of the nervous system and sense organs: Z86.69

## 2023-08-18 HISTORY — DX: Unspecified chronic bronchitis: J42

## 2023-08-18 HISTORY — DX: Elevated white blood cell count, unspecified: D72.829

## 2023-08-18 HISTORY — DX: Tobacco use: Z72.0

## 2023-08-18 HISTORY — DX: Depression, unspecified: F32.A

## 2023-08-18 NOTE — Patient Instructions (Signed)
Your procedure is scheduled on:08-25-23 Thursday Report to the Registration Desk on the 1st floor of the [facility].Then proceed to the 2nd floor Surgery Desk To find out your arrival time, please call [phone] between 1PM - 3PM on:08-24-23 Wednesday If your arrival time is 6:00 am, do not arrive before that time as the [facility] entrance doors do not open until 6:00 am.  REMEMBER: Instructions that are not followed completely may result in serious medical risk, up to and including death; or upon the discretion of your surgeon and anesthesiologist your surgery may need to be rescheduled.  Do not eat food after midnight the night before surgery.  No gum chewing or hard candies.  You may however, drink CLEAR liquids up to 2 hours before you are scheduled to arrive for your surgery. Do not drink anything within 2 hours of your scheduled arrival time.  Clear liquids include: - water  - apple juice without pulp - gatorade (not RED colors) - black coffee or tea (Do NOT add milk or creamers to the coffee or tea) Do NOT drink anything that is not on this list.  In addition, your doctor has ordered for you to drink the provided:  Ensure Pre-Surgery Clear Carbohydrate Drink  Drinking this carbohydrate drink up to two hours before surgery helps to reduce insulin resistance and improve patient outcomes. Please complete drinking 2 hours before scheduled arrival time.  One week prior to surgery: Stop Anti-inflammatories (NSAIDS) such as Advil, Aleve, Ibuprofen, Motrin, Naproxen, Naprosyn and Aspirin based products such as Excedrin, Goody's Powder, BC Powder.You may however, take Tylenol if needed for pain up until the day of surgery. Stop ANY OVER THE COUNTER supplements/vitamins NOW (08-18-23) until after surgery (Multivitamin)   Continue taking all prescribed medications  TAKE ONLY THESE MEDICATIONS THE MORNING OF SURGERY WITH A SIP OF WATER: -buPROPion (WELLBUTRIN XL)   Use your Advair  and Albuterol Inhaler the day of surgery and bring your Albuterol Inhaler to the hospital  No Alcohol for 24 hours before or after surgery.  No Smoking including e-cigarettes for 24 hours before surgery.  No chewable tobacco products for at least 6 hours before surgery.  No nicotine patches on the day of surgery.  Do not use any "recreational" drugs for at least a week (preferably 2 weeks) before your surgery.  Please be advised that the combination of cocaine and anesthesia may have negative outcomes, up to and including death. If you test positive for cocaine, your surgery will be cancelled.  On the morning of surgery brush your teeth with toothpaste and water, you may rinse your mouth with mouthwash if you wish. Do not swallow any toothpaste or mouthwash.  Use CHG Soap as directed on instruction sheet.  Do not wear jewelry, make-up, hairpins, clips or nail polish.  Do not wear lotions, powders, or perfumes.   Do not shave body hair from the neck down 48 hours before surgery.  Contact lenses, hearing aids and dentures may not be worn into surgery.  Do not bring valuables to the hospital. [facility] is not responsible for any missing/lost belongings or valuables.   Notify your doctor if there is any change in your medical condition (cold, fever, infection).  Wear comfortable clothing (specific to your surgery type) to the hospital.  After surgery, you can help prevent lung complications by doing breathing exercises.  Take deep breaths and cough every 1-2 hours. Your doctor may order a device called an Incentive Spirometer to help  you take deep breaths. When coughing or sneezing, hold a pillow firmly against your incision with both hands. This is called "splinting." Doing this helps protect your incision. It also decreases belly discomfort.  If you are being admitted to the hospital overnight, leave your suitcase in the car. After surgery it may be brought to your room.  In case  of increased patient census, it may be necessary for you, the patient, to continue your postoperative care in the Same Day Surgery department.  If you are being discharged the day of surgery, you will not be allowed to drive home. You will need a responsible individual to drive you home and stay with you for 24 hours after surgery.   If you are taking public transportation, you will need to have a responsible individual with you.  Please call the Pre-admissions Testing Dept. at [phone] if you have any questions about these instructions.  Surgery Visitation Policy:  Patients having surgery or a procedure may have two visitors.  Children under the age of 16 must have an adult with them who is not the patient.     Preparing for Surgery with CHLORHEXIDINE GLUCONATE (CHG) Soap  Chlorhexidine Gluconate (CHG) Soap  o An antiseptic cleaner that kills germs and bonds with the skin to continue killing germs even after washing  o Used for showering the night before surgery and morning of surgery  Before surgery, you can play an important role by reducing the number of germs on your skin.  CHG (Chlorhexidine gluconate) soap is an antiseptic cleanser which kills germs and bonds with the skin to continue killing germs even after washing.  Please do not use if you have an allergy to CHG or antibacterial soaps. If your skin becomes reddened/irritated stop using the CHG.  1. Shower the NIGHT BEFORE SURGERY and the MORNING OF SURGERY with CHG soap.  2. If you choose to wash your hair, wash your hair first as usual with your normal shampoo.  3. After shampooing, rinse your hair and body thoroughly to remove the shampoo.  4. Use CHG as you would any other liquid soap. You can apply CHG directly to the skin and wash gently with a scrungie or a clean washcloth.  5. Apply the CHG soap to your body only from the neck down. Do not use on open wounds or open sores. Avoid contact with your eyes,  ears, mouth, and genitals (private parts). Wash face and genitals (private parts) with your normal soap.  6. Wash thoroughly, paying special attention to the area where your surgery will be performed.  7. Thoroughly rinse your body with warm water.  8. Do not shower/wash with your normal soap after using and rinsing off the CHG soap.  9. Pat yourself dry with a clean towel.  10. Wear clean pajamas to bed the night before surgery.  12. Place clean sheets on your bed the night of your first shower and do not sleep with pets.  13. Shower again with the CHG soap on the day of surgery prior to arriving at the hospital.  14. Do not apply any deodorants/lotions/powders.  15. Please wear clean clothes to the hospital.  How to Use an Incentive Spirometer An incentive spirometer is a tool that measures how well you are filling your lungs with each breath. Learning to take long, deep breaths using this tool can help you keep your lungs clear and active. This may help to reverse or lessen your chance of developing  breathing (pulmonary) problems, especially infection. You may be asked to use a spirometer: After a surgery. If you have a lung problem or a history of smoking. After a long period of time when you have been unable to move or be active. If the spirometer includes an indicator to show the highest number that you have reached, your health care provider or respiratory therapist will help you set a goal. Keep a log of your progress as told by your health care provider. What are the risks? Breathing too quickly may cause dizziness or cause you to pass out. Take your time so you do not get dizzy or light-headed. If you are in pain, you may need to take pain medicine before doing incentive spirometry. It is harder to take a deep breath if you are having pain. How to use your incentive spirometer  Sit up on the edge of your bed or on a chair. Hold the incentive spirometer so that it is in an  upright position. Before you use the spirometer, breathe out normally. Place the mouthpiece in your mouth. Make sure your lips are closed tightly around it. Breathe in slowly and as deeply as you can through your mouth, causing the piston or the ball to rise toward the top of the chamber. Hold your breath for 3-5 seconds, or for as long as possible. If the spirometer includes a coach indicator, use this to guide you in breathing. Slow down your breathing if the indicator goes above the marked areas. Remove the mouthpiece from your mouth and breathe out normally. The piston or ball will return to the bottom of the chamber. Rest for a few seconds, then repeat the steps 10 or more times. Take your time and take a few normal breaths between deep breaths so that you do not get dizzy or light-headed. Do this every 1-2 hours when you are awake. If the spirometer includes a goal marker to show the highest number you have reached (best effort), use this as a goal to work toward during each repetition. After each set of 10 deep breaths, cough a few times. This will help to make sure that your lungs are clear. If you have an incision on your chest or abdomen from surgery, place a pillow or a rolled-up towel firmly against the incision when you cough. This can help to reduce pain while taking deep breaths and coughing. General tips When you are able to get out of bed: Walk around often. Continue to take deep breaths and cough in order to clear your lungs. Keep using the incentive spirometer until your health care provider says it is okay to stop using it. If you have been in the hospital, you may be told to keep using the spirometer at home. Contact a health care provider if: You are having difficulty using the spirometer. You have trouble using the spirometer as often as instructed. Your pain medicine is not giving enough relief for you to use the spirometer as told. You have a fever. Get help right away  if: You develop shortness of breath. You develop a cough with bloody mucus from the lungs. You have fluid or blood coming from an incision site after you cough. Summary An incentive spirometer is a tool that can help you learn to take long, deep breaths to keep your lungs clear and active. You may be asked to use a spirometer after a surgery, if you have a lung problem or a history of smoking, or if  you have been inactive for a long period of time. Use your incentive spirometer as instructed every 1-2 hours while you are awake. If you have an incision on your chest or abdomen, place a pillow or a rolled-up towel firmly against your incision when you cough. This will help to reduce pain. Get help right away if you have shortness of breath, you cough up bloody mucus, or blood comes from your incision when you cough. This information is not intended to replace advice given to you by your health care provider. Make sure you discuss any questions you have with your health care provider. Document Revised: 03/03/2020 Document Reviewed: 03/03/2020 Elsevier Patient Education  2024 [employer].

## 2023-08-24 MED ORDER — LACTATED RINGERS IV SOLN: INTRAVENOUS | Status: DC

## 2023-08-24 MED ORDER — ORAL CARE MOUTH RINSE: 15.0000 mL | Freq: Once | OROMUCOSAL | Status: AC

## 2023-08-24 MED ORDER — FAMOTIDINE 20 MG PO TABS
20.0000 mg | ORAL_TABLET | Freq: Once | ORAL | Status: AC
  Administered 2023-08-25: 20 mg via ORAL

## 2023-08-24 MED ORDER — CEFAZOLIN SODIUM-DEXTROSE 2-4 GM/100ML-% IV SOLN
2.0000 g | INTRAVENOUS | Status: AC
  Administered 2023-08-25: 2 g via INTRAVENOUS

## 2023-08-24 MED ORDER — CHLORHEXIDINE GLUCONATE 0.12 % MT SOLN
15.0000 mL | Freq: Once | OROMUCOSAL | Status: AC
  Administered 2023-08-25: 15 mL via OROMUCOSAL

## 2023-08-25 ENCOUNTER — Ambulatory Visit: Payer: 59

## 2023-08-25 ENCOUNTER — Other Ambulatory Visit: Payer: Self-pay

## 2023-08-25 ENCOUNTER — Encounter
Admission: RE | Disposition: A | Discharge status: Home / Self Care | Payer: Self-pay | Source: Ambulatory Visit | Attending: Surgery

## 2023-08-25 ENCOUNTER — Encounter: Payer: Self-pay | Admitting: Surgery

## 2023-08-25 ENCOUNTER — Ambulatory Visit: Payer: 59 | Admitting: Anesthesiology

## 2023-08-25 ENCOUNTER — Ambulatory Visit
Admission: RE | Admit: 2023-08-25 | Discharge: 2023-08-25 | Disposition: A | Discharge status: Home / Self Care | Payer: 59 | Source: Ambulatory Visit | Attending: Surgery | Admitting: Surgery

## 2023-08-25 DIAGNOSIS — G43909 Migraine, unspecified, not intractable, without status migrainosus: Secondary | ICD-10-CM | POA: Insufficient documentation

## 2023-08-25 DIAGNOSIS — X58XXXD Exposure to other specified factors, subsequent encounter: Secondary | ICD-10-CM | POA: Diagnosis not present

## 2023-08-25 DIAGNOSIS — S42022K Displaced fracture of shaft of left clavicle, subsequent encounter for fracture with nonunion: Secondary | ICD-10-CM | POA: Diagnosis present

## 2023-08-25 DIAGNOSIS — F32A Depression, unspecified: Secondary | ICD-10-CM | POA: Insufficient documentation

## 2023-08-25 DIAGNOSIS — Z8541 Personal history of malignant neoplasm of cervix uteri: Secondary | ICD-10-CM | POA: Diagnosis not present

## 2023-08-25 DIAGNOSIS — F419 Anxiety disorder, unspecified: Secondary | ICD-10-CM | POA: Diagnosis not present

## 2023-08-25 DIAGNOSIS — J4489 Other specified chronic obstructive pulmonary disease: Secondary | ICD-10-CM | POA: Diagnosis not present

## 2023-08-25 DIAGNOSIS — F17210 Nicotine dependence, cigarettes, uncomplicated: Secondary | ICD-10-CM | POA: Insufficient documentation

## 2023-08-25 HISTORY — PX: ORIF CLAVICULAR FRACTURE: SHX5055

## 2023-08-25 SURGERY — OPEN REDUCTION INTERNAL FIXATION (ORIF) CLAVICULAR FRACTURE
Anesthesia: General | Site: Shoulder | Laterality: Left

## 2023-08-25 MED ORDER — DEXAMETHASONE SODIUM PHOSPHATE 10 MG/ML IJ SOLN
INTRAMUSCULAR | Status: AC
  Filled 2023-08-25: qty 1

## 2023-08-25 MED ORDER — BUPIVACAINE LIPOSOME 1.3 % IJ SUSP
INTRAMUSCULAR | Status: AC
  Filled 2023-08-25: qty 10

## 2023-08-25 MED ORDER — ONDANSETRON HCL 4 MG PO TABS: 4.0000 mg | ORAL_TABLET | Freq: Four times a day (QID) | ORAL | Status: DC | PRN

## 2023-08-25 MED ORDER — METOCLOPRAMIDE HCL 5 MG/ML IJ SOLN
INTRAMUSCULAR | Status: AC
  Filled 2023-08-25: qty 2

## 2023-08-25 MED ORDER — OXYCODONE HCL 5 MG PO TABS
5.0000 mg | ORAL_TABLET | Freq: Once | ORAL | Status: AC | PRN
  Administered 2023-08-25: 5 mg via ORAL

## 2023-08-25 MED ORDER — LIDOCAINE HCL (CARDIAC) PF 100 MG/5ML IV SOSY
PREFILLED_SYRINGE | INTRAVENOUS | Status: DC | PRN
  Administered 2023-08-25: 50 mg via INTRAVENOUS

## 2023-08-25 MED ORDER — ONDANSETRON HCL 4 MG/2ML IJ SOLN
INTRAMUSCULAR | Status: AC
  Filled 2023-08-25: qty 2

## 2023-08-25 MED ORDER — KETOROLAC TROMETHAMINE 15 MG/ML IJ SOLN
15.0000 mg | Freq: Once | INTRAMUSCULAR | Status: AC
  Administered 2023-08-25: 15 mg via INTRAVENOUS

## 2023-08-25 MED ORDER — ONDANSETRON HCL 4 MG/2ML IJ SOLN
INTRAMUSCULAR | Status: DC | PRN
  Administered 2023-08-25 (×2): 4 mg via INTRAVENOUS

## 2023-08-25 MED ORDER — MIDAZOLAM HCL 2 MG/2ML IJ SOLN
INTRAMUSCULAR | Status: DC | PRN
  Administered 2023-08-25: 1 mg via INTRAVENOUS

## 2023-08-25 MED ORDER — EPHEDRINE SULFATE (PRESSORS) 50 MG/ML IJ SOLN
INTRAMUSCULAR | Status: DC | PRN
  Administered 2023-08-25: 10 mg via INTRAVENOUS

## 2023-08-25 MED ORDER — ACETAMINOPHEN 10 MG/ML IV SOLN
INTRAVENOUS | Status: AC
  Filled 2023-08-25: qty 100

## 2023-08-25 MED ORDER — FENTANYL CITRATE (PF) 100 MCG/2ML IJ SOLN: 25.0000 ug | INTRAMUSCULAR | Status: DC | PRN

## 2023-08-25 MED ORDER — BUPIVACAINE-EPINEPHRINE (PF) 0.5% -1:200000 IJ SOLN
INTRAMUSCULAR | Status: DC | PRN
  Administered 2023-08-25: 10 mL

## 2023-08-25 MED ORDER — ALBUTEROL SULFATE HFA 108 (90 BASE) MCG/ACT IN AERS
INHALATION_SPRAY | RESPIRATORY_TRACT | Status: AC
  Filled 2023-08-25: qty 6.7

## 2023-08-25 MED ORDER — PROPOFOL 10 MG/ML IV BOLUS
INTRAVENOUS | Status: AC
  Filled 2023-08-25: qty 20

## 2023-08-25 MED ORDER — ACETAMINOPHEN 10 MG/ML IV SOLN
INTRAVENOUS | Status: DC | PRN
  Administered 2023-08-25: 1000 mg via INTRAVENOUS

## 2023-08-25 MED ORDER — KETOROLAC TROMETHAMINE 15 MG/ML IJ SOLN
INTRAMUSCULAR | Status: AC
  Filled 2023-08-25: qty 1

## 2023-08-25 MED ORDER — ROCURONIUM BROMIDE 10 MG/ML (PF) SYRINGE
PREFILLED_SYRINGE | INTRAVENOUS | Status: AC
  Filled 2023-08-25: qty 10

## 2023-08-25 MED ORDER — 0.9 % SODIUM CHLORIDE (POUR BTL) OPTIME
TOPICAL | Status: DC | PRN
  Administered 2023-08-25: 500 mL

## 2023-08-25 MED ORDER — ACETAMINOPHEN 325 MG PO TABS: 325.0000 mg | ORAL_TABLET | Freq: Four times a day (QID) | ORAL | Status: DC | PRN

## 2023-08-25 MED ORDER — FENTANYL CITRATE (PF) 100 MCG/2ML IJ SOLN
INTRAMUSCULAR | Status: AC
  Filled 2023-08-25: qty 2

## 2023-08-25 MED ORDER — OXYCODONE HCL 5 MG/5ML PO SOLN: 5.0000 mg | Freq: Once | ORAL | Status: AC | PRN

## 2023-08-25 MED ORDER — METOCLOPRAMIDE HCL 10 MG PO TABS: 5.0000 mg | ORAL_TABLET | Freq: Three times a day (TID) | ORAL | Status: DC | PRN

## 2023-08-25 MED ORDER — ROCURONIUM BROMIDE 100 MG/10ML IV SOLN
INTRAVENOUS | Status: DC | PRN
  Administered 2023-08-25: 45 mg via INTRAVENOUS
  Administered 2023-08-25: 15 mg via INTRAVENOUS

## 2023-08-25 MED ORDER — KETAMINE HCL 50 MG/5ML IJ SOSY
PREFILLED_SYRINGE | INTRAMUSCULAR | Status: AC
  Filled 2023-08-25: qty 5

## 2023-08-25 MED ORDER — PHENYLEPHRINE 80 MCG/ML (10ML) SYRINGE FOR IV PUSH (FOR BLOOD PRESSURE SUPPORT)
PREFILLED_SYRINGE | INTRAVENOUS | Status: DC | PRN
  Administered 2023-08-25: 80 ug via INTRAVENOUS
  Administered 2023-08-25 (×2): 160 ug via INTRAVENOUS
  Administered 2023-08-25: 240 ug via INTRAVENOUS

## 2023-08-25 MED ORDER — ONDANSETRON HCL 4 MG/2ML IJ SOLN: 4.0000 mg | Freq: Four times a day (QID) | INTRAMUSCULAR | Status: DC | PRN

## 2023-08-25 MED ORDER — ACETAMINOPHEN 10 MG/ML IV SOLN: 1000.0000 mg | Freq: Once | INTRAVENOUS | Status: DC | PRN

## 2023-08-25 MED ORDER — PHENYLEPHRINE 80 MCG/ML (10ML) SYRINGE FOR IV PUSH (FOR BLOOD PRESSURE SUPPORT)
PREFILLED_SYRINGE | INTRAVENOUS | Status: AC
  Filled 2023-08-25: qty 10

## 2023-08-25 MED ORDER — PHENYLEPHRINE HCL-NACL 20-0.9 MG/250ML-% IV SOLN
INTRAVENOUS | Status: DC | PRN
  Administered 2023-08-25: 50 ug/min via INTRAVENOUS

## 2023-08-25 MED ORDER — ONDANSETRON HCL 4 MG/2ML IJ SOLN: 4.0000 mg | Freq: Once | INTRAMUSCULAR | Status: DC | PRN

## 2023-08-25 MED ORDER — MIDAZOLAM HCL 2 MG/2ML IJ SOLN
INTRAMUSCULAR | Status: AC
  Filled 2023-08-25: qty 2

## 2023-08-25 MED ORDER — DEXAMETHASONE SODIUM PHOSPHATE 10 MG/ML IJ SOLN
INTRAMUSCULAR | Status: DC | PRN
  Administered 2023-08-25: 10 mg via INTRAVENOUS

## 2023-08-25 MED ORDER — PROPOFOL 10 MG/ML IV BOLUS
INTRAVENOUS | Status: DC | PRN
  Administered 2023-08-25: 200 mg via INTRAVENOUS

## 2023-08-25 MED ORDER — CHLORHEXIDINE GLUCONATE 0.12 % MT SOLN
OROMUCOSAL | Status: AC
  Filled 2023-08-25: qty 15

## 2023-08-25 MED ORDER — BUPIVACAINE-EPINEPHRINE (PF) 0.5% -1:200000 IJ SOLN
INTRAMUSCULAR | Status: AC
  Filled 2023-08-25: qty 10

## 2023-08-25 MED ORDER — HYDROCODONE-ACETAMINOPHEN 5-325 MG PO TABS: 1.0000 | ORAL_TABLET | ORAL | Status: DC | PRN

## 2023-08-25 MED ORDER — CEFAZOLIN SODIUM-DEXTROSE 2-4 GM/100ML-% IV SOLN
INTRAVENOUS | Status: AC
  Filled 2023-08-25: qty 100

## 2023-08-25 MED ORDER — HYDROMORPHONE HCL 1 MG/ML IJ SOLN
INTRAMUSCULAR | Status: DC | PRN
  Administered 2023-08-25: .25 mg via INTRAVENOUS

## 2023-08-25 MED ORDER — HYDROMORPHONE HCL 1 MG/ML IJ SOLN
INTRAMUSCULAR | Status: AC
  Filled 2023-08-25: qty 1

## 2023-08-25 MED ORDER — HYDROCODONE-ACETAMINOPHEN 5-325 MG PO TABS: 1.0000 | ORAL_TABLET | Freq: Four times a day (QID) | ORAL | 0 refills | Status: AC | PRN

## 2023-08-25 MED ORDER — ALBUTEROL SULFATE HFA 108 (90 BASE) MCG/ACT IN AERS
INHALATION_SPRAY | RESPIRATORY_TRACT | Status: DC | PRN
Start: 2023-08-25 — End: 2023-08-25
  Administered 2023-08-25 (×2): 4 via RESPIRATORY_TRACT

## 2023-08-25 MED ORDER — METOCLOPRAMIDE HCL 5 MG/ML IJ SOLN
5.0000 mg | Freq: Three times a day (TID) | INTRAMUSCULAR | Status: DC | PRN
  Administered 2023-08-25: 5 mg via INTRAVENOUS

## 2023-08-25 MED ORDER — SODIUM CHLORIDE 0.9 % IV SOLN: INTRAVENOUS | Status: DC

## 2023-08-25 MED ORDER — KETAMINE HCL 10 MG/ML IJ SOLN
INTRAMUSCULAR | Status: DC | PRN
  Administered 2023-08-25 (×2): 25 mg via INTRAVENOUS

## 2023-08-25 MED ORDER — BUPIVACAINE LIPOSOME 1.3 % IJ SUSP
INTRAMUSCULAR | Status: DC | PRN
Start: 2023-08-25 — End: 2023-08-25
  Administered 2023-08-25: 10 mL

## 2023-08-25 MED ORDER — FAMOTIDINE 20 MG PO TABS
ORAL_TABLET | ORAL | Status: AC
  Filled 2023-08-25: qty 1

## 2023-08-25 MED ORDER — OXYCODONE HCL 5 MG PO TABS
ORAL_TABLET | ORAL | Status: AC
  Filled 2023-08-25: qty 1

## 2023-08-25 MED ORDER — FENTANYL CITRATE (PF) 100 MCG/2ML IJ SOLN
INTRAMUSCULAR | Status: DC | PRN
  Administered 2023-08-25 (×2): 50 ug via INTRAVENOUS

## 2023-08-25 SURGICAL SUPPLY — 52 items
APL PRP STRL LF DISP 70% ISPRP (MISCELLANEOUS) ×2
BIT DRILL 2.8X5 QR DISP (BIT) IMPLANT
BIT DRILL QUICK RELEASE 3.5MM (BIT) IMPLANT
BLADE OSC/SAGITTAL MD 5.5X18 (BLADE) IMPLANT
BNDG CMPR 5X4 CHSV STRCH STRL (GAUZE/BANDAGES/DRESSINGS) ×1
BNDG COHESIVE 4X5 TAN STRL LF (GAUZE/BANDAGES/DRESSINGS) ×1 IMPLANT
BUR 3.0X8 (BURR) IMPLANT
CHLORAPREP W/TINT 26 (MISCELLANEOUS) ×2 IMPLANT
DRAPE C-ARM XRAY 36X54 (DRAPES) ×1 IMPLANT
DRAPE INCISE IOBAN 66X45 STRL (DRAPES) ×2 IMPLANT
DRILL QUICK RELEASE 3.5MM (BIT) ×1
DRSG OPSITE POSTOP 4X6 (GAUZE/BANDAGES/DRESSINGS) ×1 IMPLANT
GAUZE SPONGE 4X4 12PLY STRL (GAUZE/BANDAGES/DRESSINGS) ×1 IMPLANT
GAUZE XEROFORM 1X8 LF (GAUZE/BANDAGES/DRESSINGS) IMPLANT
GLOVE BIO SURGEON STRL SZ7.5 (GLOVE) ×2 IMPLANT
GLOVE BIO SURGEON STRL SZ8 (GLOVE) ×2 IMPLANT
GLOVE BIOGEL PI IND STRL 8 (GLOVE) ×2 IMPLANT
GLOVE INDICATOR 8.0 STRL GRN (GLOVE) ×1 IMPLANT
GOWN STRL REUS W/ TWL LRG LVL3 (GOWN DISPOSABLE) ×1 IMPLANT
GOWN STRL REUS W/ TWL XL LVL3 (GOWN DISPOSABLE) ×1 IMPLANT
GOWN STRL REUS W/TWL LRG LVL3 (GOWN DISPOSABLE) ×1
GOWN STRL REUS W/TWL XL LVL3 (GOWN DISPOSABLE) ×1
IMMOBILIZER SHDR LG LX 900803 (SOFTGOODS) ×1 IMPLANT
KIT STABILIZATION SHOULDER (MISCELLANEOUS) ×1 IMPLANT
KIT TURNOVER KIT A (KITS) ×1 IMPLANT
MANIFOLD NEPTUNE II (INSTRUMENTS) ×1 IMPLANT
MASK FACE SPIDER DISP (MASK) ×1 IMPLANT
MAT ABSORB FLUID 56X50 GRAY (MISCELLANEOUS) ×1 IMPLANT
NDL FILTER BLUNT 18X1 1/2 (NEEDLE) ×1 IMPLANT
NEEDLE FILTER BLUNT 18X1 1/2 (NEEDLE) ×1 IMPLANT
NS IRRIG 500ML POUR BTL (IV SOLUTION) ×1 IMPLANT
PACK ARTHROSCOPY SHOULDER (MISCELLANEOUS) ×1 IMPLANT
PLATE CLAV LOCK 6H SML (Plate) IMPLANT
PUTTY DBX 1CC (Putty) ×1 IMPLANT
PUTTY DBX 1CC DEPUY (Putty) IMPLANT
SCREW HEXALOBE LOCKING 3.5X14M (Screw) IMPLANT
SCREW HEXALOBE NON-LOCK 3.5X14 (Screw) IMPLANT
SCREW LOCK 12X3.5X HEXALOBE (Screw) IMPLANT
SCREW LOCKING 3.5X12 (Screw) ×1 IMPLANT
SCREW NONLOCK HEX 3.5X12 (Screw) IMPLANT
SPONGE T-LAP 18X18 ~~LOC~~+RFID (SPONGE) ×1 IMPLANT
STAPLER SKIN PROX 35W (STAPLE) ×1 IMPLANT
STRIP CLOSURE SKIN 1/2X4 (GAUZE/BANDAGES/DRESSINGS) ×1 IMPLANT
SUT VIC AB 0 CT1 36 (SUTURE) IMPLANT
SUT VIC AB 2-0 CT1 27 (SUTURE) ×2
SUT VIC AB 2-0 CT1 TAPERPNT 27 (SUTURE) ×2 IMPLANT
SUT VIC AB 2-0 CT2 27 (SUTURE) ×2 IMPLANT
SUT VIC AB 3-0 SH 27 (SUTURE) ×1
SUT VIC AB 3-0 SH 27X BRD (SUTURE) IMPLANT
SYR 10ML LL (SYRINGE) ×1 IMPLANT
TRAP FLUID SMOKE EVACUATOR (MISCELLANEOUS) ×1 IMPLANT
WATER STERILE IRR 500ML POUR (IV SOLUTION) ×1 IMPLANT

## 2023-08-25 NOTE — Transfer of Care (Signed)
Immediate Anesthesia Transfer of Care Note  Patient: Connie Anderson  Procedure(s) Performed: OPEN REDUCTION INTERNAL FIXATION LEFT CLAVICLE (Left: Shoulder)  Patient Location: PACU  Anesthesia Type:General  Level of Consciousness: awake, drowsy, and patient cooperative  Airway & Oxygen Therapy: Patient Spontanous Breathing and Patient connected to face mask oxygen  Post-op Assessment: Report given to RN and Post -op Vital signs reviewed and stable  Post vital signs: Reviewed and stable  Last Vitals:  Vitals Value Taken Time  BP 143/86 08/25/23 1503  Temp    Pulse 88 08/25/23 1509  Resp 21 08/25/23 1509  SpO2 97 % 08/25/23 1509  Vitals shown include unfiled device data.  Last Pain:  Vitals:   08/25/23 1154  TempSrc: Temporal  PainSc: 0-No pain         Complications: No notable events documented.
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
## 2023-08-25 NOTE — H&P (Signed)
History of Present Illness: Connie Anderson is a 62 y.o. female who presents today for her surgical history and physical for upcoming open reduction internal fixation of left displaced clavicle fracture nonunion. Surgery is scheduled with Dr. Joice Lofts on 08/25/2023. The patient does continue to report a 4 out of 10 pain score over the left clavicle. She denies any repeat trauma or injury affecting the left shoulder since her last evaluation. The patient denies any personal history of heart attack or stroke. She denies any history of DVT or diabetes. The patient does have a history of chronic bronchitis, she is a smoker. She states that she smokes approximately 4-5 cigarettes a day. She has been working on cutting back. The patient has been out of work since her initial injury. The patient is not experiencing any numbness or tingling the left upper extremity at today's visit.  Past Medical History: Allergic rhinitis  Anxiety  Depression  Hx of migraine headaches  Mild reactive airways disease (HHS-HCC)  Tobacco abuse   Past Surgical History: LEEP procedure 1995  HYSTERECTOMY 1995 (partial secondary to abnormal Pap smears (ovaries intact))   Past Family History: Diabetes Mother  Dementia Mother  Stroke Father   Medications: albuterol MDI, PROVENTIL, VENTOLIN, PROAIR, HFA 90 mcg/actuation inhaler Inhale 2 inhalations into the lungs every 6 (six) hours as needed for up to 30 days 9 g 5  buPROPion (WELLBUTRIN XL) 300 MG XL tablet Take 1 tablet (300 mg total) by mouth 2 (two) times daily 180 tablet 1  ergocalciferol, vitamin D2, 1,250 mcg (50,000 unit) capsule Take 1 capsule (50,000 Units total) by mouth once a week 13 capsule 1  fluticasone propion-salmeteroL (ADVAIR DISKUS) 100-50 mcg/dose diskus inhaler Inhale 1 Puff into the lungs every 12 (twelve) hours for 180 days 60 each 5  montelukast (SINGULAIR) 10 mg tablet Take 1 tablet (10 mg total) by mouth at bedtime for 180 days 90 tablet 1   multivitamin capsule Take 1 capsule by mouth once daily  rizatriptan (MAXALT-MLT) 10 MG disintegrating tablet Take 1 tablet (10 mg total) by mouth as directed for Migraine May take a second dose after 2 hours if needed. 10 tablet 5   Allergies: Tetracycline Unknown   Review of Systems:  A comprehensive 14 point ROS was performed, reviewed by me today, and the pertinent orthopaedic findings are documented in the HPI.  Physical Exam: BP 122/88  Ht 160 cm (5\' 3" )  Wt 74.7 kg (164 lb 9.6 oz)  BMI 29.16 kg/m  General/Constitutional: The patient appears to be well-nourished, well-developed, and in no acute distress. Neuro/Psych: Normal mood and affect, oriented to person, place and time. Eyes: Non-icteric. Pupils are equal, round, and reactive to light, and exhibit synchronous movement. ENT: Unremarkable. Lymphatic: No palpable adenopathy. Respiratory: Moderate wheeze noted bilaterally, Normal chest excursion, and Non-labored breathing Cardiovascular: Regular rate and rhythm. No murmurs. and No edema, swelling or tenderness, except as noted in detailed exam. Integumentary: No impressive skin lesions present, except as noted in detailed exam. Musculoskeletal: Unremarkable, except as noted in detailed exam.  Skin examination of the left shoulder does reveal a deformity along the superior aspect of the midportion of the left clavicle. The patient does have tenderness with palpation over this area. The patient does have intact shoulder range of motion with pain at the extremes. There is no open wound, erythema or ecchymosis. No signs of infection. She is intact light touch on the left upper extremity. Cap refill is intact to each individual digit. Radial  pulses intact to the left wrist.  Imaging: Previous AP, lateral, x-rays of the left clavicle were reviewed at today's visit. 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   Impression: 1. 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  Plan:  1. Treatment options were discussed today with the patient. 2. The patient is scheduled for a left clavicle nonunion open reduction internal fixation with Dr. Joice Lofts on 08/25/2023. 3. The patient was instructed on the risk and benefits of surgical intervention at this time. 4. We did have a conversation about continued smoking cessation as this will significantly reduce the likelihood of recurrent nonunion. 5. This document will serve as a surgical history and physical for the patient. Updated work note was also provided to the patient. 6. The patient will follow-up per standard postop protocol. They can call the clinic they have any questions, new symptoms develop or symptoms worsen.  The procedure was discussed with the patient, as were the potential risks (including bleeding, infection, nerve and/or blood vessel injury, persistent or recurrent pain, failure of the reduction, nonunion, numbness, hardware irritation, progression of arthritis, need for further surgery, blood clots, strokes, heart attacks and/or arhythmias, pneumonia, etc.) and benefits. The patient states her understanding and wishes to proceed.    H&P reviewed and patient re-examined. No changes.

## 2023-08-25 NOTE — Discharge Instructions (Addendum)
Orthopedic discharge instructions: May shower with intact OpSite dressing, then reapply shoulder immobilizer. Apply ice frequently to shoulder. Take ibuprofen 600-800 mg TID with meals for 3-5 days, then as necessary. Take oxycodone as prescribed when needed.  May supplement with ES Tylenol if necessary. Keep shoulder immobilizer on at all times except may remove for bathing purposes. Follow-up in 10-14 days or as scheduled.     AMBULATORY SURGERY  DISCHARGE INSTRUCTIONS  The drugs that you were given will stay in your system until tomorrow so for the next 24 hours you should not:  Drive an automobile Make any legal decisions Drink any alcoholic beverage  You may resume regular meals tomorrow.  Today it is better to start with liquids and gradually work up to solid foods.  You may eat anything you prefer, but it is better to start with liquids, then soup and crackers, and gradually work up to solid foods.  Please notify your doctor immediately if you have any unusual bleeding, trouble breathing, redness and pain at the surgery site, drainage, fever, or pain not relieved by medication.  Additional Instructions:  Please contact your physician with any problems or Same Day Surgery at [phone], Monday through Friday 6 am to 4 pm, or [place] at [facility] number at [phone].  Information for Discharge Teaching: DO NOT REMOVE TEAL EXPAREL BRACELET FOR 4DAYS, (96 hours) EXPAREL (bupivacaine liposome injectable suspension)   Pain relief is important to your recovery. The goal is to control your pain so you can move easier and return to your normal activities as soon as possible after your procedure. Your physician may use several types of medicines to manage pain, swelling, and more.  Your surgeon or anesthesiologist gave you EXPAREL(bupivacaine) to help control your pain after surgery.  EXPAREL is a local anesthetic designed to release slowly over an extended period of  time to provide pain relief by numbing the tissue around the surgical site. EXPAREL is designed to release pain medication over time and can control pain for up to 72 hours. Depending on how you respond to EXPAREL, you may require less pain medication during your recovery. EXPAREL can help reduce or eliminate the need for opioids during the first few days after surgery when pain relief is needed the most. EXPAREL is not an opioid and is not addictive. It does not cause sleepiness or sedation.   Important! A teal colored band has been placed on your arm with the date, time and amount of EXPAREL you have received. Please leave this armband in place for the full 96 hours following administration, and then you may remove the band. If you return to the hospital for any reason within 96 hours following the administration of EXPAREL, the armband provides important information that your health care providers to know, and alerts them that you have received this anesthetic.    Possible side effects of EXPAREL: Temporary loss of sensation or ability to move in the area where medication was injected. Nausea, vomiting, constipation Rarely, numbness and tingling in your mouth or lips, lightheadedness, or anxiety may occur. Call your doctor right away if you think you may be experiencing any of these sensations, or if you have other questions regarding possible side effects.  Follow all other discharge instructions given to you by your surgeon or nurse. Eat a healthy diet and drink plenty of water or other fluids.
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
## 2023-08-25 NOTE — Anesthesia Procedure Notes (Signed)
Procedure Name: Intubation Date/Time: 08/25/2023 12:56 PM  Performed by: Jeannene Patella, CRNAPre-anesthesia Checklist: Emergency Drugs available, Patient identified, Suction available, Patient being monitored and Timeout performed Patient Re-evaluated:Patient Re-evaluated prior to induction Oxygen Delivery Method: Circle system utilized Preoxygenation: Pre-oxygenation with 100% oxygen Induction Type: IV induction Ventilation: Mask ventilation with difficulty Laryngoscope Size: McGraph and 4 Grade View: Grade II Tube type: Oral Tube size: 6.5 mm Number of attempts: 1 Airway Equipment and Method: Stylet, LTA kit utilized and Video-laryngoscopy Placement Confirmation: ETT inserted through vocal cords under direct vision, positive ETCO2 and breath sounds checked- equal and bilateral Secured at: 21 (lip) cm Tube secured with: Tape Dental Injury: Teeth and Oropharynx as per pre-operative assessment  Comments: + wheeze bilaterally distant EBBS will give albuterol via ETT

## 2023-08-26 ENCOUNTER — Encounter: Payer: Self-pay | Admitting: Surgery

## 2023-08-26 NOTE — Anesthesia Postprocedure Evaluation (Signed)
Anesthesia Post Note  Patient: Connie Anderson  Procedure(s) Performed: OPEN REDUCTION INTERNAL FIXATION LEFT CLAVICLE (Left: Shoulder)  Patient location during evaluation: PACU Anesthesia Type: General Level of consciousness: awake and alert Pain management: pain level controlled Vital Signs Assessment: post-procedure vital signs reviewed and stable Respiratory status: spontaneous breathing, nonlabored ventilation, respiratory function stable and patient connected to nasal cannula oxygen Cardiovascular status: blood pressure returned to baseline and stable Postop Assessment: no apparent nausea or vomiting Anesthetic complications: no   No notable events documented.   Last Vitals:  Vitals:   08/25/23 1613 08/25/23 1709  BP: 124/64 132/79  Pulse: 84 79  Resp: 16 17  Temp: 36.6 C (!) 36.3 C  SpO2: 93% 93%    Last Pain:  Vitals:   08/25/23 1709  TempSrc: Temporal  PainSc:                  Corinda Gubler

## 2023-10-03 ENCOUNTER — Ambulatory Visit
Admission: RE | Admit: 2023-10-03 | Discharge: 2023-10-03 | Disposition: A | Discharge status: Home / Self Care | Payer: 59 | Source: Ambulatory Visit | Attending: Acute Care | Admitting: Acute Care

## 2023-10-03 DIAGNOSIS — R911 Solitary pulmonary nodule: Secondary | ICD-10-CM

## 2023-10-03 DIAGNOSIS — F17210 Nicotine dependence, cigarettes, uncomplicated: Secondary | ICD-10-CM

## 2023-10-03 DIAGNOSIS — Z87891 Personal history of nicotine dependence: Secondary | ICD-10-CM

## 2023-10-19 ENCOUNTER — Other Ambulatory Visit: Payer: Self-pay | Admitting: Emergency Medicine

## 2023-10-19 DIAGNOSIS — Z87891 Personal history of nicotine dependence: Secondary | ICD-10-CM

## 2023-10-19 DIAGNOSIS — F17210 Nicotine dependence, cigarettes, uncomplicated: Secondary | ICD-10-CM

## 2023-10-19 DIAGNOSIS — Z122 Encounter for screening for malignant neoplasm of respiratory organs: Secondary | ICD-10-CM

## 2024-09-26 ENCOUNTER — Encounter: Payer: Self-pay | Admitting: Acute Care
